# Patient Record
Sex: Male | Born: 1935 | Hispanic: No | Marital: Married | Smoking: Former smoker
Health system: Southern US, Community
[De-identification: ages and names within clinical notes are randomized; demographics above are authoritative.]

## PROBLEM LIST (undated history)

## (undated) DIAGNOSIS — I350 Nonrheumatic aortic (valve) stenosis: Secondary | ICD-10-CM

## (undated) DIAGNOSIS — I2510 Atherosclerotic heart disease of native coronary artery without angina pectoris: Secondary | ICD-10-CM

## (undated) DIAGNOSIS — I5042 Chronic combined systolic (congestive) and diastolic (congestive) heart failure: Secondary | ICD-10-CM

## (undated) DIAGNOSIS — R7301 Impaired fasting glucose: Secondary | ICD-10-CM

## (undated) DIAGNOSIS — I4892 Unspecified atrial flutter: Secondary | ICD-10-CM

## (undated) DIAGNOSIS — I447 Left bundle-branch block, unspecified: Secondary | ICD-10-CM

## (undated) DIAGNOSIS — I420 Dilated cardiomyopathy: Secondary | ICD-10-CM

## (undated) DIAGNOSIS — I10 Essential (primary) hypertension: Secondary | ICD-10-CM

## (undated) DIAGNOSIS — E7800 Pure hypercholesterolemia, unspecified: Secondary | ICD-10-CM

## (undated) HISTORY — DX: Nonrheumatic aortic (valve) stenosis: I35.0

## (undated) HISTORY — DX: Atherosclerotic heart disease of native coronary artery without angina pectoris: I25.10

## (undated) HISTORY — DX: Pure hypercholesterolemia, unspecified: E78.00

## (undated) HISTORY — DX: Dilated cardiomyopathy: I42.0

## (undated) HISTORY — DX: Impaired fasting glucose: R73.01

## (undated) HISTORY — DX: Unspecified atrial flutter: I48.92

## (undated) HISTORY — DX: Left bundle-branch block, unspecified: I44.7

## (undated) HISTORY — DX: Chronic combined systolic (congestive) and diastolic (congestive) heart failure: I50.42

## (undated) HISTORY — DX: Essential (primary) hypertension: I10

---

## 1994-11-13 HISTORY — PX: AORTIC VALVE REPLACEMENT: SHX41

## 1995-05-14 DIAGNOSIS — I2510 Atherosclerotic heart disease of native coronary artery without angina pectoris: Secondary | ICD-10-CM

## 1995-05-14 HISTORY — DX: Atherosclerotic heart disease of native coronary artery without angina pectoris: I25.10

## 2010-07-27 ENCOUNTER — Ambulatory Visit: Payer: Self-pay | Admitting: Cardiology

## 2010-09-30 ENCOUNTER — Ambulatory Visit: Payer: Self-pay | Admitting: Cardiology

## 2011-07-18 ENCOUNTER — Encounter: Payer: Self-pay | Admitting: Cardiology

## 2011-07-18 ENCOUNTER — Ambulatory Visit (INDEPENDENT_AMBULATORY_CARE_PROVIDER_SITE_OTHER): Payer: Medicare Other | Admitting: Cardiology

## 2011-07-18 VITALS — BP 120/66 | HR 64 | Ht 67.0 in | Wt 159.0 lb

## 2011-07-18 DIAGNOSIS — I350 Nonrheumatic aortic (valve) stenosis: Secondary | ICD-10-CM

## 2011-07-18 DIAGNOSIS — I447 Left bundle-branch block, unspecified: Secondary | ICD-10-CM

## 2011-07-18 DIAGNOSIS — E7800 Pure hypercholesterolemia, unspecified: Secondary | ICD-10-CM

## 2011-07-18 DIAGNOSIS — Z952 Presence of prosthetic heart valve: Secondary | ICD-10-CM

## 2011-07-18 DIAGNOSIS — I2510 Atherosclerotic heart disease of native coronary artery without angina pectoris: Secondary | ICD-10-CM

## 2011-07-18 DIAGNOSIS — I428 Other cardiomyopathies: Secondary | ICD-10-CM

## 2011-07-18 DIAGNOSIS — I359 Nonrheumatic aortic valve disorder, unspecified: Secondary | ICD-10-CM

## 2011-07-18 DIAGNOSIS — I10 Essential (primary) hypertension: Secondary | ICD-10-CM

## 2011-07-18 DIAGNOSIS — I420 Dilated cardiomyopathy: Secondary | ICD-10-CM | POA: Insufficient documentation

## 2011-07-18 MED ORDER — METOPROLOL SUCCINATE ER 25 MG PO TB24: 12.5000 mg | ORAL_TABLET | Freq: Every day | ORAL | Status: DC

## 2011-07-18 NOTE — Patient Instructions (Addendum)
Continue your current medications.  We will schedule you for a follow echocardiogram.  We will start Toprol XL 25 mg one half tablet daily.  I will see you again in 6 months.

## 2011-07-18 NOTE — Progress Notes (Signed)
   [name] Date of Birth: 1930/05/30   History of Present Illness: [name] is seen today for followup of his valvular heart disease. He states he feels very well. His energy level is excellent. 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 He denies any shortness of breath or chest pain. He has had no increase in edema or orthopnea. Previously Coreg made him feel very fatigued and so he quit taking it. He has also been switched from Vytorin to simvastatin and from Diovan to losartan.  Current Outpatient Prescriptions on File Prior to Visit  Medication Sig Dispense Refill  . Cetirizine HCl (ZYRTEC PO) Take by mouth.        . losartan (COZAAR) 100 MG tablet Take 100 mg by mouth daily.        . Saw Palmetto, Serenoa repens, (SAW PALMETTO PO) Take by mouth.        . simvastatin (ZOCOR) 20 MG tablet Take 20 mg by mouth every other day.       . spironolactone (ALDACTONE) 25 MG tablet Take 25 mg by mouth daily.        . Tamsulosin HCl (FLOMAX) 0.4 MG CAPS Take by mouth.        . warfarin (COUMADIN) 3 MG tablet Take 3 mg by mouth daily. Take as Directed         No Known Allergies  Past Medical History  Diagnosis Date  . Aortic stenosis     Status pst redo aortic valve replacement in 1996 with mechanical prosthesis  . Coronary artery disease 05-1995    Status post stent  . Bundle branch block, left   . Hypertension   . Hypercholesterolemia   . Dilated cardiomyopathy     Past Surgical History  Procedure Date  . Aortic valve replacement 1996    History  Smoking status  . Former Smoker  Smokeless tobacco  . Not on file    History  Alcohol Use No    History reviewed. No pertinent family history.  Review of Systems: The review of systems is positive for low testosterone. He was placed on a supplement but this made his PSA level increased and it was discontinued. All other systems were reviewed and are negative.  Physical Exam: BP 120/66   Pulse 64  Ht 5\' 7"  (1.702 m)  Wt 159 lb (72.122 kg)  BMI 24.90 kg/m2 The patient is alert and oriented x 3.  The mood and affect are normal.  The skin is warm and dry.  Color is normal.  The HEENT exam reveals that the sclera are nonicteric.  The mucous membranes are moist.  The carotids are 2+ without bruits.  There is no thyromegaly.  There is no JVD.  The lungs are clear.  The chest wall is non tender.  The heart exam reveals a regular rate with a good mechanical aortic valve click.  There are no murmurs, gallops, or rubs.  The PMI is not displaced.   Abdominal exam reveals good bowel sounds.  There is no guarding or rebound.  There is no hepatosplenomegaly or tenderness.  There are no masses.  Exam of the legs reveal no clubbing, cyanosis, or edema.  The legs are without rashes.  The distal pulses are intact.  Cranial nerves II - XII are intact.  Motor and sensory functions are intact.  The gait is normal. LABORATORY DATA:   Assessment / Plan:

## 2011-07-18 NOTE — Assessment & Plan Note (Signed)
Blood pressure is well controlled on his current medication.

## 2011-07-18 NOTE — Assessment & Plan Note (Signed)
He is asymptomatic and his valve appears to be functioning well on exam. He is on chronic anticoagulation. He needs routine SBE prophylaxis.

## 2011-07-18 NOTE — Assessment & Plan Note (Signed)
He does have a chronic left bundle branch block. His last echocardiogram from one year ago showed an ejection fraction of 30%. He is on chronic therapy with an ARB and spironolactone. He was intolerant of Coreg. We will try him on metoprolol 12.5 mg daily. We will schedule him for a followup echocardiogram. He is currently asymptomatic.

## 2011-07-19 ENCOUNTER — Ambulatory Visit (HOSPITAL_COMMUNITY): Payer: Medicare Other | Attending: Cardiology

## 2011-07-19 DIAGNOSIS — Z954 Presence of other heart-valve replacement: Secondary | ICD-10-CM | POA: Insufficient documentation

## 2011-07-19 DIAGNOSIS — E785 Hyperlipidemia, unspecified: Secondary | ICD-10-CM | POA: Insufficient documentation

## 2011-07-19 DIAGNOSIS — I10 Essential (primary) hypertension: Secondary | ICD-10-CM | POA: Insufficient documentation

## 2011-07-19 DIAGNOSIS — Z952 Presence of prosthetic heart valve: Secondary | ICD-10-CM

## 2011-07-19 DIAGNOSIS — I447 Left bundle-branch block, unspecified: Secondary | ICD-10-CM | POA: Insufficient documentation

## 2011-07-19 DIAGNOSIS — I080 Rheumatic disorders of both mitral and aortic valves: Secondary | ICD-10-CM | POA: Insufficient documentation

## 2011-07-19 DIAGNOSIS — I379 Nonrheumatic pulmonary valve disorder, unspecified: Secondary | ICD-10-CM | POA: Insufficient documentation

## 2011-07-19 DIAGNOSIS — I2510 Atherosclerotic heart disease of native coronary artery without angina pectoris: Secondary | ICD-10-CM | POA: Insufficient documentation

## 2011-07-19 DIAGNOSIS — I359 Nonrheumatic aortic valve disorder, unspecified: Secondary | ICD-10-CM

## 2011-07-19 DIAGNOSIS — I079 Rheumatic tricuspid valve disease, unspecified: Secondary | ICD-10-CM | POA: Insufficient documentation

## 2011-07-20 ENCOUNTER — Telehealth: Payer: Self-pay | Admitting: *Deleted

## 2011-07-20 NOTE — Telephone Encounter (Signed)
Message copied by Lorayne Bender on Thu Jul 20, 2011 10:17 AM ------      Message from: [place], PETER M      Created: Wed Jul 19, 2011  9:03 PM       EF is still low. AV prosthesis is functioning well. We'll see how he responds to metoprolol. May need to consider ICD in future.      Theron Arista [place]

## 2011-07-20 NOTE — Telephone Encounter (Signed)
Notified of Echo results. Advised to notify us if unable to tolerate Metoprolol before he just stops taking medication. States they do not want ICD unless absolute necessary.

## 2011-07-28 ENCOUNTER — Telehealth: Payer: Self-pay | Admitting: Cardiology

## 2011-07-28 NOTE — Telephone Encounter (Signed)
Wife called stating Christopher Mann can not tolerate Toprol. Causing muscle ache and soreness. Is refusing to take any more medication. Advised Dr. [place]. Was given to help w/LV function.

## 2011-07-28 NOTE — Telephone Encounter (Signed)
Pt's wife states she was told to call the office back if her husband has any difficulties with his new medication.  Her husband will not take his medication that he switched to.  The name of the medication is Toprol XL 25mg , at half a one per day.  Pt states it makes him sore and muscle ache.  Pt is aware since after 4pm may not receive call until Monday morning.  Synetta Fail has been notified.  Please call pt back.

## 2011-08-22 ENCOUNTER — Telehealth: Payer: Self-pay | Admitting: Cardiology

## 2011-08-22 NOTE — Telephone Encounter (Signed)
Wife called stating that Christopher Mann is taking the Metoprolol 12.5 mg as instructed by Dr. [place]. Also wants to know what other medication he could be on for cholesterol.. He has tried Vytorin; is currently taking Zocor 20 mg; but continues to have aching in shoulders and legs. Advised will get chart and speak w/Dr. [place] tomorrow and call her back. States he was on another med before seeing Dr. [place] but doesn't remember what that was. Will call her after getting chart.

## 2011-08-22 NOTE — Telephone Encounter (Signed)
Pt's wife called about zocor med he was taking before began seeing dr [place] please call

## 2011-08-23 ENCOUNTER — Telehealth: Payer: Self-pay | Admitting: Cardiology

## 2011-08-23 NOTE — Telephone Encounter (Signed)
Pt returning your call

## 2011-08-31 MED ORDER — ATORVASTATIN CALCIUM 10 MG PO TABS: 10.0000 mg | ORAL_TABLET | Freq: Every day | ORAL | Status: DC

## 2011-08-31 NOTE — Telephone Encounter (Signed)
Have tried muotiple times to get in touch w/ pt. Wife states Zocor that he is taking every other day is causing him to have aching "all over". Dr. [place] suggested he try Lipitor 10 mg. Advised to stop Zocor x 2 wks before starting on Lipitor. Will need to get labs in 3 mo. Will send Rx to [facility].

## 2011-12-29 ENCOUNTER — Ambulatory Visit: Payer: Medicare Other | Admitting: Cardiology

## 2011-12-29 ENCOUNTER — Other Ambulatory Visit (INDEPENDENT_AMBULATORY_CARE_PROVIDER_SITE_OTHER): Payer: Medicare Other | Admitting: *Deleted

## 2011-12-29 ENCOUNTER — Other Ambulatory Visit: Payer: Self-pay

## 2011-12-29 DIAGNOSIS — E7800 Pure hypercholesterolemia, unspecified: Secondary | ICD-10-CM

## 2011-12-29 DIAGNOSIS — I2510 Atherosclerotic heart disease of native coronary artery without angina pectoris: Secondary | ICD-10-CM

## 2011-12-29 DIAGNOSIS — I350 Nonrheumatic aortic (valve) stenosis: Secondary | ICD-10-CM

## 2011-12-29 DIAGNOSIS — I359 Nonrheumatic aortic valve disorder, unspecified: Secondary | ICD-10-CM

## 2011-12-29 DIAGNOSIS — I10 Essential (primary) hypertension: Secondary | ICD-10-CM

## 2011-12-29 LAB — HEPATIC FUNCTION PANEL
ALT: 18 U/L (ref 0–53)
Total Bilirubin: 0.5 mg/dL (ref 0.3–1.2)

## 2011-12-29 LAB — BASIC METABOLIC PANEL
CO2: 22 mEq/L (ref 19–32)
Calcium: 8.8 mg/dL (ref 8.4–10.5)
Chloride: 109 mEq/L (ref 96–112)
Potassium: 4.4 mEq/L (ref 3.5–5.1)
Sodium: 139 mEq/L (ref 135–145)

## 2011-12-29 LAB — LIPID PANEL
HDL: 33.5 mg/dL — ABNORMAL LOW (ref >39.00)
Total CHOL/HDL Ratio: 5
Triglycerides: 317 mg/dL — ABNORMAL HIGH (ref 0.0–149.0)

## 2011-12-29 LAB — LDL CHOLESTEROL, DIRECT: Direct LDL: 83.2 mg/dL

## 2012-01-04 ENCOUNTER — Telehealth: Payer: Self-pay | Admitting: Cardiology

## 2012-01-04 NOTE — Telephone Encounter (Signed)
New problem:  Patient  Wife calling regarding test results.

## 2012-01-05 ENCOUNTER — Encounter: Payer: Self-pay | Admitting: *Deleted

## 2012-01-05 NOTE — Telephone Encounter (Signed)
Patient's wife called was given lab results. Advised to take fish oil 2 grams daily.

## 2012-01-10 ENCOUNTER — Ambulatory Visit (INDEPENDENT_AMBULATORY_CARE_PROVIDER_SITE_OTHER): Payer: Medicare Other | Admitting: Cardiology

## 2012-01-10 ENCOUNTER — Encounter: Payer: Self-pay | Admitting: Cardiology

## 2012-01-10 VITALS — BP 102/52 | HR 58 | Ht 67.0 in | Wt 159.0 lb

## 2012-01-10 DIAGNOSIS — I10 Essential (primary) hypertension: Secondary | ICD-10-CM

## 2012-01-10 DIAGNOSIS — Z952 Presence of prosthetic heart valve: Secondary | ICD-10-CM

## 2012-01-10 DIAGNOSIS — I428 Other cardiomyopathies: Secondary | ICD-10-CM

## 2012-01-10 DIAGNOSIS — I447 Left bundle-branch block, unspecified: Secondary | ICD-10-CM

## 2012-01-10 DIAGNOSIS — I359 Nonrheumatic aortic valve disorder, unspecified: Secondary | ICD-10-CM

## 2012-01-10 DIAGNOSIS — I350 Nonrheumatic aortic (valve) stenosis: Secondary | ICD-10-CM

## 2012-01-10 DIAGNOSIS — I420 Dilated cardiomyopathy: Secondary | ICD-10-CM

## 2012-01-10 DIAGNOSIS — E785 Hyperlipidemia, unspecified: Secondary | ICD-10-CM

## 2012-01-10 NOTE — Assessment & Plan Note (Signed)
He is status post redo aortic valve replacement in 1996 with a mechanical prosthesis. He is on chronic Coumadin therapy. His valve sounds crisp on exam today. Continue his current therapy.

## 2012-01-10 NOTE — Patient Instructions (Signed)
Continue your current medication  I will see you again in 6 months with fasting lab.

## 2012-01-10 NOTE — Assessment & Plan Note (Signed)
Blood pressure is lower now with the addition of beta blocker. I don't think we can titrate his medications further.

## 2012-01-10 NOTE — Progress Notes (Signed)
   Christopher Mann Date of Birth: 1930-01-31   History of Present Illness: [name] is seen today for followup of his valvular heart disease. He states he feels very well. His energy level is excellent. 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 He denies any shortness of breath or chest pain. He has had no increase in edema or orthopnea. His last echocardiogram showed persistent low ejection fraction. We added a low dose metoprolol and he has tolerated this well.  Current Outpatient Prescriptions on File Prior to Visit  Medication Sig Dispense Refill  . Cetirizine HCl (ZYRTEC PO) Take by mouth.        . losartan (COZAAR) 100 MG tablet Take 100 mg by mouth daily.        . metoprolol succinate (TOPROL-XL) 25 MG 24 hr tablet Take 0.5 tablets (12.5 mg total) by mouth daily.  15 tablet  6  . Saw Palmetto, Serenoa repens, (SAW PALMETTO PO) Take by mouth.        . spironolactone (ALDACTONE) 25 MG tablet Take 25 mg by mouth daily.        . Tamsulosin HCl (FLOMAX) 0.4 MG CAPS Take by mouth.        . warfarin (COUMADIN) 3 MG tablet Take 3 mg by mouth daily. Take as Directed         No Known Allergies  Past Medical History  Diagnosis Date  . Aortic stenosis     Status pst redo aortic valve replacement in 1996 with mechanical prosthesis  . Coronary artery disease 05-1995    Status post stent  . Bundle branch block, left   . Hypertension   . Hypercholesterolemia   . Dilated cardiomyopathy     Past Surgical History  Procedure Date  . Aortic valve replacement 1996    History  Smoking status  . Former Smoker  Smokeless tobacco  . Not on file    History  Alcohol Use No    History reviewed. No pertinent family history.  Review of Systems: As noted in history of present illness. All other systems were reviewed and are negative.  Physical Exam: BP 102/52  Pulse 58  Ht 5\' 7"  (1.702 m)  Wt 159 lb (72.122 kg)  BMI 24.90 kg/m2 The patient is alert and  oriented x 3.  The mood and affect are normal.  The skin is warm and dry.  Color is normal.  The HEENT exam reveals that the sclera are nonicteric.  The mucous membranes are moist.  The carotids are 2+ without bruits.  There is no thyromegaly.  There is no JVD.  The lungs are clear.  The chest wall is non tender.  The heart exam reveals a regular rate with a good mechanical aortic valve click.  There are no murmurs, gallops, or rubs.  The PMI is not displaced.   Abdominal exam reveals good bowel sounds.  There is no guarding or rebound.  There is no hepatosplenomegaly or tenderness.  There are no masses.  Exam of the legs reveal no clubbing, cyanosis, or edema.  The legs are without rashes.  The distal pulses are intact.  Cranial nerves II - XII are intact.  Motor and sensory functions are intact.  The gait is normal. LABORATORY DATA: ECG today demonstrates normal sinus rhythm with first degree AV block. There are occasional PVCs. He has a left bundle branch block which is chronic.  Assessment / Plan:

## 2012-01-10 NOTE — Assessment & Plan Note (Signed)
He has a persistent low ejection fraction related to his valvular disease. He is on optimal therapy now with beta blocker, ARB, and Aldactone. He is well beta blocked. He has no overt evidence of congestive heart failure. Continue on his medical therapy and followup again in 6 months.

## 2012-01-19 ENCOUNTER — Telehealth: Payer: Self-pay | Admitting: Cardiology

## 2012-01-19 NOTE — Telephone Encounter (Signed)
[facility] refill request Jantoven 3mg  , pt requesting #120mg 

## 2012-01-22 ENCOUNTER — Other Ambulatory Visit: Payer: Self-pay

## 2012-01-22 MED ORDER — LOSARTAN POTASSIUM 100 MG PO TABS: 100.0000 mg | ORAL_TABLET | Freq: Every day | ORAL | Status: DC

## 2012-01-22 NOTE — Telephone Encounter (Signed)
Pt calling re rx request, pt calling from pharmacy and they do not have the med yet, pt out now . pls call, asap

## 2012-01-23 ENCOUNTER — Other Ambulatory Visit: Payer: Self-pay

## 2012-01-23 MED ORDER — METOPROLOL SUCCINATE ER 25 MG PO TB24: 12.5000 mg | ORAL_TABLET | Freq: Every day | ORAL | Status: DC

## 2012-01-23 MED ORDER — SIMVASTATIN 20 MG PO TABS: 20.0000 mg | ORAL_TABLET | Freq: Every evening | ORAL | Status: DC

## 2012-01-23 MED ORDER — SPIRONOLACTONE 25 MG PO TABS: 25.0000 mg | ORAL_TABLET | Freq: Every day | ORAL | Status: DC

## 2012-01-23 MED ORDER — LOSARTAN POTASSIUM 100 MG PO TABS: 100.0000 mg | ORAL_TABLET | Freq: Every day | ORAL | Status: DC

## 2012-01-23 MED ORDER — TAMSULOSIN HCL 0.4 MG PO CAPS: 0.4000 mg | ORAL_CAPSULE | Freq: Every day | ORAL | Status: DC

## 2012-01-23 NOTE — Telephone Encounter (Signed)
Patient called needing refills on all of his medications sent to [facility].Metoprolol succ 25 mg,lorsartan 100 mg, spironolactone 25 mg, simvastatin 20 mg, tamsulosin 0.4 mg sent to [facility].

## 2012-02-14 ENCOUNTER — Other Ambulatory Visit: Payer: Self-pay

## 2012-02-15 MED ORDER — METOPROLOL SUCCINATE ER 25 MG PO TB24: 12.5000 mg | ORAL_TABLET | Freq: Every day | ORAL | Status: DC

## 2012-02-15 NOTE — Telephone Encounter (Signed)
..   Requested Prescriptions   Signed Prescriptions Disp Refills  . metoprolol succinate (TOPROL-XL) 25 MG 24 hr tablet 15 tablet 6    Sig: Take 0.5 tablets (12.5 mg total) by mouth daily.    Authorizing Provider: [place], PETER M    Ordering User: Lacie Scotts

## 2012-07-02 ENCOUNTER — Ambulatory Visit: Payer: Medicare Other | Admitting: Nurse Practitioner

## 2012-07-25 ENCOUNTER — Ambulatory Visit (INDEPENDENT_AMBULATORY_CARE_PROVIDER_SITE_OTHER): Payer: Medicare Other | Admitting: Cardiology

## 2012-07-25 ENCOUNTER — Encounter: Payer: Self-pay | Admitting: Cardiology

## 2012-07-25 VITALS — BP 140/60 | HR 55 | Ht 67.0 in | Wt 160.0 lb

## 2012-07-25 DIAGNOSIS — I359 Nonrheumatic aortic valve disorder, unspecified: Secondary | ICD-10-CM

## 2012-07-25 DIAGNOSIS — Z954 Presence of other heart-valve replacement: Secondary | ICD-10-CM

## 2012-07-25 DIAGNOSIS — Z952 Presence of prosthetic heart valve: Secondary | ICD-10-CM | POA: Insufficient documentation

## 2012-07-25 DIAGNOSIS — I10 Essential (primary) hypertension: Secondary | ICD-10-CM

## 2012-07-25 DIAGNOSIS — I420 Dilated cardiomyopathy: Secondary | ICD-10-CM

## 2012-07-25 DIAGNOSIS — I428 Other cardiomyopathies: Secondary | ICD-10-CM

## 2012-07-25 DIAGNOSIS — I2510 Atherosclerotic heart disease of native coronary artery without angina pectoris: Secondary | ICD-10-CM

## 2012-07-25 DIAGNOSIS — I350 Nonrheumatic aortic (valve) stenosis: Secondary | ICD-10-CM

## 2012-07-25 MED ORDER — SPIRONOLACTONE 25 MG PO TABS: 25.0000 mg | ORAL_TABLET | Freq: Every day | ORAL | Status: DC

## 2012-07-25 NOTE — Progress Notes (Signed)
Christopher Mann Date of Birth: 1930/10/18   History of Present Illness: Christopher Mann is seen today for surgical clearance for arthroscopic knee surgery. He has a known history of valvular heart disease. He is status post redo aortic valve replacement in 1996 with a mechanical prosthesis. He also has a history of coronary disease with a remote coronary stent. He has a history of dilated cardiomyopathy with ejection fraction of 25-30% by echocardiogram in September of 2012. He is on chronic Coumadin therapy. His major complaint today is of pain and limitation of motion in his left knee. He denies any symptoms of chest pain, shortness of breath, or palpitations. He's had no orthopnea or PND. He denies any increase weight or edema. He is tolerating his medications well.  Current Outpatient Prescriptions on File Prior to Visit  Medication Sig Dispense Refill  . losartan (COZAAR) 100 MG tablet Take 1 tablet (100 mg total) by mouth daily.  120 tablet  6  . metoprolol succinate (TOPROL-XL) 25 MG 24 hr tablet Take 0.5 tablets (12.5 mg total) by mouth daily.  15 tablet  6  . Saw Palmetto, Serenoa repens, (SAW PALMETTO PO) Take by mouth.        . spironolactone (ALDACTONE) 25 MG tablet Take 1 tablet (25 mg total) by mouth daily.  120 tablet  6  . Tamsulosin HCl (FLOMAX) 0.4 MG CAPS Take 1 capsule (0.4 mg total) by mouth daily.  120 capsule  6  . warfarin (COUMADIN) 3 MG tablet Take 3 mg by mouth daily. Take as Directed       . DISCONTD: simvastatin (ZOCOR) 20 MG tablet Take 1 tablet (20 mg total) by mouth every evening.  120 tablet  6    No Known Allergies  Past Medical History  Diagnosis Date  . Aortic stenosis     Status pst redo aortic valve replacement in 1996 with mechanical prosthesis  . Coronary artery disease 05-1995    Status post stent  . Bundle branch block, left   . Hypertension   . Hypercholesterolemia   . Dilated cardiomyopathy     Past Surgical History  Procedure Date  . Aortic  valve replacement 1996    History  Smoking status  . Former Smoker  Smokeless tobacco  . Not on file    History  Alcohol Use No    History reviewed. No pertinent family history.  Review of Systems: As noted in history of present illness. All other systems were reviewed and are negative.  Physical Exam: BP 140/60  Pulse 55  Ht 5\' 7"  (1.702 m)  Wt 160 lb (72.576 kg)  BMI 25.06 kg/m2 The patient is alert and oriented x 3.  The mood and affect are normal.  The skin is warm and dry.  Color is normal.  The HEENT exam reveals that the sclera are nonicteric.  The mucous membranes are moist.  The carotids are 2+ without bruits.  There is no thyromegaly.  There is no JVD.  The lungs are clear.  The chest wall is non tender.  The heart exam reveals a regular rate with a good mechanical aortic valve click.  There are no murmurs, gallops, or rubs.  The PMI is not displaced.   Abdominal exam reveals good bowel sounds.  There is no guarding or rebound.  There is no hepatosplenomegaly or tenderness.  There are no masses.  Exam of the legs reveal no clubbing, cyanosis, or edema.  The legs are without rashes.  The  distal pulses are intact.  Cranial nerves II - XII are intact.  Motor and sensory functions are intact.  The gait is normal.  LABORATORY DATA: ECG today demonstrates normal sinus rhythm a rate of 55 beats per minute. He has a chronic left bundle branch block.  Assessment / Plan: 1. Chronic systolic CHF with dilated cardiomyopathy. Ejection fraction of 25-30%. Patient is asymptomatic. Continue treatment with ARB, metoprolol, and Aldactone.  2. Status post redo aortic valve replacement with mechanical prosthesis. Functioning well by echocardiogram in September 2012.  3. Hypertension, controlled.  4. Coronary disease with remote coronary stent.  5. Meniscus tear and left knee. Patient is cleared for surgery from a cardiac standpoint. He will need bridging Lovenox therapy to come off his  Coumadin for surgery.

## 2012-11-29 ENCOUNTER — Other Ambulatory Visit: Payer: Self-pay

## 2012-11-29 MED ORDER — METOPROLOL SUCCINATE ER 25 MG PO TB24: 12.5000 mg | ORAL_TABLET | Freq: Every day | ORAL | Status: DC

## 2013-03-11 ENCOUNTER — Other Ambulatory Visit: Payer: Self-pay

## 2013-03-11 MED ORDER — METOPROLOL SUCCINATE ER 25 MG PO TB24: 12.5000 mg | ORAL_TABLET | Freq: Every day | ORAL | Status: DC

## 2013-05-19 ENCOUNTER — Telehealth: Payer: Self-pay | Admitting: Cardiology

## 2013-05-19 DIAGNOSIS — I2510 Atherosclerotic heart disease of native coronary artery without angina pectoris: Secondary | ICD-10-CM

## 2013-05-19 NOTE — Telephone Encounter (Signed)
Returned call to patient's wife no answer.Left message on personal voice mail will put in orders for patient to have fasting lab work on 05/23/13.

## 2013-05-19 NOTE — Telephone Encounter (Signed)
New Prob    Requesting fasting labs on day of appt 7/11. Orders needed in EPIC.

## 2013-05-23 ENCOUNTER — Ambulatory Visit (INDEPENDENT_AMBULATORY_CARE_PROVIDER_SITE_OTHER): Payer: Medicare Other | Admitting: Cardiology

## 2013-05-23 ENCOUNTER — Other Ambulatory Visit: Payer: Medicare Other

## 2013-05-23 ENCOUNTER — Ambulatory Visit: Payer: Medicare Other | Admitting: Cardiology

## 2013-05-23 ENCOUNTER — Encounter: Payer: Self-pay | Admitting: Cardiology

## 2013-05-23 VITALS — BP 134/72 | HR 54 | Ht 67.0 in | Wt 156.4 lb

## 2013-05-23 DIAGNOSIS — I447 Left bundle-branch block, unspecified: Secondary | ICD-10-CM

## 2013-05-23 DIAGNOSIS — I420 Dilated cardiomyopathy: Secondary | ICD-10-CM

## 2013-05-23 DIAGNOSIS — E7800 Pure hypercholesterolemia, unspecified: Secondary | ICD-10-CM

## 2013-05-23 DIAGNOSIS — I350 Nonrheumatic aortic (valve) stenosis: Secondary | ICD-10-CM

## 2013-05-23 DIAGNOSIS — I428 Other cardiomyopathies: Secondary | ICD-10-CM

## 2013-05-23 DIAGNOSIS — I2510 Atherosclerotic heart disease of native coronary artery without angina pectoris: Secondary | ICD-10-CM

## 2013-05-23 DIAGNOSIS — I359 Nonrheumatic aortic valve disorder, unspecified: Secondary | ICD-10-CM

## 2013-05-23 DIAGNOSIS — Z954 Presence of other heart-valve replacement: Secondary | ICD-10-CM

## 2013-05-23 DIAGNOSIS — Z952 Presence of prosthetic heart valve: Secondary | ICD-10-CM

## 2013-05-23 LAB — LIPID PANEL
HDL: 33.4 mg/dL — ABNORMAL LOW (ref >39.00)
Total CHOL/HDL Ratio: 6
Triglycerides: 233 mg/dL — ABNORMAL HIGH (ref 0.0–149.0)

## 2013-05-23 LAB — BASIC METABOLIC PANEL
CO2: 27 mEq/L (ref 19–32)
Chloride: 108 mEq/L (ref 96–112)
Sodium: 140 mEq/L (ref 135–145)

## 2013-05-23 LAB — HEPATIC FUNCTION PANEL: Total Bilirubin: 0.9 mg/dL (ref 0.3–1.2)

## 2013-05-23 LAB — LDL CHOLESTEROL, DIRECT: Direct LDL: 109.3 mg/dL

## 2013-05-23 NOTE — Progress Notes (Signed)
Christopher Mann Date of Birth: 1929/11/23   History of Present Illness: Christopher Mann is seen today for follow up. He has a known history of valvular heart disease. He is status post redo aortic valve replacement in 1996 with a mechanical prosthesis. He also has a history of coronary disease with a remote coronary stent. He has a history of dilated cardiomyopathy with ejection fraction of 25-30% by echocardiogram in September of 2012. He is on chronic Coumadin therapy. On followup today he reports he is doing very well. He is very active. The only time he gets short of breath is when he bends over. He has no increase in edema or orthopnea. He denies any chest pain or palpitations. He developed significant myalgias on Zocor to the point where he couldn't function. He stop taking this. He is now taking a supplement that contains plant sterol esters.  Current Outpatient Prescriptions on File Prior to Visit  Medication Sig Dispense Refill  . losartan (COZAAR) 100 MG tablet Take 1 tablet (100 mg total) by mouth daily.  120 tablet  6  . metoprolol succinate (TOPROL-XL) 25 MG 24 hr tablet Take 0.5 tablets (12.5 mg total) by mouth daily.  45 tablet  2  . Saw Palmetto, Serenoa repens, (SAW PALMETTO PO) Take by mouth.        . spironolactone (ALDACTONE) 25 MG tablet Take 1 tablet (25 mg total) by mouth daily.  120 tablet  6  . Tamsulosin HCl (FLOMAX) 0.4 MG CAPS Take 1 capsule (0.4 mg total) by mouth daily.  120 capsule  6  . warfarin (COUMADIN) 3 MG tablet Take 3 mg by mouth daily. Take as Directed        No current facility-administered medications on file prior to visit.    No Known Allergies  Past Medical History  Diagnosis Date  . Aortic stenosis     Status pst redo aortic valve replacement in 1996 with mechanical prosthesis  . Coronary artery disease 05-1995    Status post stent  . Bundle branch block, left   . Hypertension   . Hypercholesterolemia   . Dilated cardiomyopathy     Past  Surgical History  Procedure Laterality Date  . Aortic valve replacement  1996    History  Smoking status  . Former Smoker  Smokeless tobacco  . Not on file    History  Alcohol Use No    History reviewed. No pertinent family history.  Review of Systems: As noted in history of present illness. All other systems were reviewed and are negative.  Physical Exam: BP 134/72  Pulse 54  Ht 5\' 7"  (1.702 m)  Wt 156 lb 6.4 oz (70.943 kg)  BMI 24.49 kg/m2  SpO2 97% The patient is alert and oriented x 3.    The skin is warm and dry.  Color is normal.  The HEENT exam reveals that the sclera are nonicteric.  The mucous membranes are moist.  The carotids are 2+ without bruits.  There is no thyromegaly.  There is no JVD.  The lungs are clear.   The heart exam reveals a regular rate with a good mechanical aortic valve click.  There are no murmurs, gallops, or rubs.  The PMI is not displaced.   Abdominal exam reveals good bowel sounds.    There is no hepatosplenomegaly or tenderness.  There are no masses.  Exam of the legs reveal no clubbing, cyanosis, or edema.   The distal pulses are intact.  Cranial  nerves II - XII are intact.  Motor and sensory functions are intact.  The gait is normal.  LABORATORY DATA: Fasting lab work is pending today.  Assessment / Plan: 1. Chronic systolic CHF with dilated cardiomyopathy. Ejection fraction of 25-30%. Patient is asymptomatic. Continue treatment with ARB, metoprolol, and Aldactone.  2. Status post redo aortic valve replacement with mechanical prosthesis. Functioning well by echocardiogram in September 2012.  3. Hypertension, controlled.  4. Coronary disease with remote coronary stent. Asymptomatic.  5. Hyperlipidemia. Intolerant to statin therapy. We will check fasting lab work today and make recommendations. On his last lab work his triglycerides were elevated and he had a low HDL so we may recommend starting with fish oil supplements.  4. Coronary  disease with remote coronary stent.  5. Meniscus tear and left knee. Patient is cleared for surgery from a cardiac standpoint. He will need bridging Lovenox therapy to come off his Coumadin for surgery.

## 2013-05-23 NOTE — Patient Instructions (Signed)
We will check your lab work today   Continue your other therapy.  I will see you in 6 months.

## 2013-05-23 NOTE — Addendum Note (Signed)
Addended by: Awilda Bill on: 05/23/2013 12:41 PM   Modules accepted: Orders

## 2013-05-26 ENCOUNTER — Other Ambulatory Visit: Payer: Self-pay | Admitting: *Deleted

## 2013-05-26 MED ORDER — LOSARTAN POTASSIUM 100 MG PO TABS: 100.0000 mg | ORAL_TABLET | Freq: Every day | ORAL | Status: DC

## 2013-05-27 ENCOUNTER — Telehealth: Payer: Self-pay | Admitting: Cardiology

## 2013-05-27 ENCOUNTER — Other Ambulatory Visit: Payer: Self-pay

## 2013-05-27 DIAGNOSIS — E7800 Pure hypercholesterolemia, unspecified: Secondary | ICD-10-CM

## 2013-05-27 NOTE — Telephone Encounter (Signed)
Follow Up ° °Pt returning call from earlier. Please call. °

## 2013-05-28 NOTE — Telephone Encounter (Signed)
Spoke to patient's wife 05/27/13 lab results given.

## 2013-05-28 NOTE — Telephone Encounter (Signed)
Returned call to patient no answer.LMTC. 

## 2013-10-20 ENCOUNTER — Other Ambulatory Visit: Payer: Self-pay

## 2013-10-20 DIAGNOSIS — I420 Dilated cardiomyopathy: Secondary | ICD-10-CM

## 2013-10-20 MED ORDER — SPIRONOLACTONE 25 MG PO TABS: 25.0000 mg | ORAL_TABLET | Freq: Every day | ORAL | Status: DC

## 2013-10-20 MED ORDER — LOSARTAN POTASSIUM 100 MG PO TABS: 100.0000 mg | ORAL_TABLET | Freq: Every day | ORAL | Status: DC

## 2013-12-11 ENCOUNTER — Encounter: Payer: Self-pay | Admitting: Cardiology

## 2013-12-11 ENCOUNTER — Ambulatory Visit (INDEPENDENT_AMBULATORY_CARE_PROVIDER_SITE_OTHER): Payer: Medicare Other | Admitting: Cardiology

## 2013-12-11 VITALS — BP 118/68 | HR 52 | Ht 67.0 in | Wt 159.8 lb

## 2013-12-11 DIAGNOSIS — I428 Other cardiomyopathies: Secondary | ICD-10-CM

## 2013-12-11 DIAGNOSIS — I2510 Atherosclerotic heart disease of native coronary artery without angina pectoris: Secondary | ICD-10-CM

## 2013-12-11 DIAGNOSIS — I420 Dilated cardiomyopathy: Secondary | ICD-10-CM

## 2013-12-11 DIAGNOSIS — Z952 Presence of prosthetic heart valve: Secondary | ICD-10-CM

## 2013-12-11 DIAGNOSIS — I350 Nonrheumatic aortic (valve) stenosis: Secondary | ICD-10-CM

## 2013-12-11 DIAGNOSIS — Z954 Presence of other heart-valve replacement: Secondary | ICD-10-CM

## 2013-12-11 DIAGNOSIS — I447 Left bundle-branch block, unspecified: Secondary | ICD-10-CM

## 2013-12-11 DIAGNOSIS — I359 Nonrheumatic aortic valve disorder, unspecified: Secondary | ICD-10-CM

## 2013-12-11 LAB — LDL CHOLESTEROL, DIRECT: LDL DIRECT: 109.7 mg/dL

## 2013-12-11 LAB — LIPID PANEL
Cholesterol: 191 mg/dL (ref 0–200)
HDL: 32.1 mg/dL — ABNORMAL LOW (ref >39.00)
Total CHOL/HDL Ratio: 6
Triglycerides: 248 mg/dL — ABNORMAL HIGH (ref 0.0–149.0)
VLDL: 49.6 mg/dL — AB (ref 0.0–40.0)

## 2013-12-11 NOTE — Patient Instructions (Signed)
We will check your lab work today.  Continue your current therapy.  I will see you in 6 months.

## 2013-12-11 NOTE — Progress Notes (Signed)
Meredith LeedsWilliam Ridolfi Date of Birth: 20-Jan-1930   History of Present Illness: [name] is seen today for follow up. He has a known history of valvular heart disease. He is status post redo aortic valve replacement in 1996 with a mechanical prosthesis. He also has a history of coronary disease with a remote coronary stent. He has a history of dilated cardiomyopathy with ejection fraction of 25-30% by echocardiogram in September of 2012. He is on chronic Coumadin therapy. On followup today he reports he is doing very well. He is very active. He denies dyspnea, increase in edema or orthopnea. He denies any chest pain or palpitations. He is intolerant of statins.  Current Outpatient Prescriptions on File Prior to Visit  Medication Sig Dispense Refill  . losartan (COZAAR) 100 MG tablet Take 1 tablet (100 mg total) by mouth daily.  90 tablet  1  . Omega-3 Fatty Acids (FISH OIL) 1000 MG CAPS Take 2 tablets( 2000 mg) twice a day  100 capsule  6  . Saw Palmetto, Serenoa repens, (SAW PALMETTO PO) Take by mouth.        . spironolactone (ALDACTONE) 25 MG tablet Take 1 tablet (25 mg total) by mouth daily.  120 tablet  6  . Tamsulosin HCl (FLOMAX) 0.4 MG CAPS Take 1 capsule (0.4 mg total) by mouth daily.  120 capsule  6  . warfarin (COUMADIN) 3 MG tablet Take 3 mg by mouth daily. Take as Directed        No current facility-administered medications on file prior to visit.    No Known Allergies  Past Medical History  Diagnosis Date  . Aortic stenosis     Status pst redo aortic valve replacement in 1996 with mechanical prosthesis  . Coronary artery disease 05-1995    Status post stent  . Bundle branch block, left   . Hypertension   . Hypercholesterolemia   . Dilated cardiomyopathy     Past Surgical History  Procedure Laterality Date  . Aortic valve replacement  1996    History  Smoking status  . Former Smoker  Smokeless tobacco  . Not on file    History  Alcohol Use No    History  reviewed. No pertinent family history.  Review of Systems: As noted in history of present illness. All other systems were reviewed and are negative.  Physical Exam: BP 118/68  Pulse 52  Ht 5\' 7"  (1.702 m)  Wt 159 lb 12.8 oz (72.485 kg)  BMI 25.02 kg/m2 The patient is alert and oriented x 3.    The skin is warm and dry.  Color is normal.  The HEENT exam reveals that the sclera are nonicteric.  The mucous membranes are moist.  The carotids are 2+ without bruits.  There is no thyromegaly.  There is no JVD.  The lungs are clear.   The heart exam reveals a regular rate with a good mechanical aortic valve click.  There are no murmurs, gallops, or rubs.  The PMI is not displaced.   Abdominal exam reveals good bowel sounds.    There is no hepatosplenomegaly or tenderness.  There are no masses.  Exam of the legs reveal no clubbing, cyanosis, or edema.   The distal pulses are intact.  Cranial nerves II - XII are intact.  Motor and sensory functions are intact.  The gait is normal.  LABORATORY DATA: Ecg: NSR rate 59 bpm, LBBB.  Assessment / Plan: 1. Chronic systolic CHF with dilated cardiomyopathy. Ejection fraction  of 25-30%. Patient is asymptomatic. Continue treatment with ARB, metoprolol, and Aldactone.  2. Status post redo aortic valve replacement with mechanical prosthesis. Functioning well by echocardiogram in September 2012.  3. Hypertension, controlled.  4. Coronary disease with remote coronary stent. Asymptomatic.  5. Hyperlipidemia. Intolerant to statin therapy. Taking OTC plant sterol esters. Will check lipid panel today.  4. Coronary disease with remote coronary stent.  5. Meniscus tear and left knee. Symptoms resolved. Did not have surgery.

## 2014-04-23 ENCOUNTER — Telehealth: Payer: Self-pay | Admitting: Cardiology

## 2014-04-23 DIAGNOSIS — E7800 Pure hypercholesterolemia, unspecified: Secondary | ICD-10-CM

## 2014-04-23 DIAGNOSIS — I2510 Atherosclerotic heart disease of native coronary artery without angina pectoris: Secondary | ICD-10-CM

## 2014-04-23 DIAGNOSIS — I10 Essential (primary) hypertension: Secondary | ICD-10-CM

## 2014-04-23 NOTE — Telephone Encounter (Signed)
I spoke with pt wife. Pt is not having any problems at this time. He is due around August for his 6 month follow-up & would like an appointment around that time I told her I would forward this to his nurse Elnita Maxwell to look at Dr. Elvis Coil schedule & call back to pt.  Wife agrees to this & is reassured Mylo Red RN

## 2014-04-23 NOTE — Telephone Encounter (Signed)
New Message:  Mrs. Orne called to set up pt's recall appt.. We scheduled Christopher Mann for the next available.. Mrs Pont is requesting the pt be worked in to see Dr. Swaziland for a sooner appt. States she has some concerns about his medications. Requests a call back from the nurse

## 2014-04-24 NOTE — Telephone Encounter (Signed)
Returned call to patient's wife no answer.Unable to leave a message mail box full.

## 2014-04-24 NOTE — Addendum Note (Signed)
Addended by: Meda Klinefelter D on: 04/24/2014 02:58 PM   Modules accepted: Orders

## 2014-04-24 NOTE — Telephone Encounter (Signed)
Received call from patient's wife.She stated husband would like fasting lab work before appointment with Dr.Jordan.Stated he would also like appointment before Sept.Fasting lab appointment scheduled 04/27/14.Appointment scheduled with Dr.Jordan 07/01/14 at [facility] office.

## 2014-04-27 ENCOUNTER — Other Ambulatory Visit (INDEPENDENT_AMBULATORY_CARE_PROVIDER_SITE_OTHER): Payer: Medicare Other

## 2014-04-27 DIAGNOSIS — I10 Essential (primary) hypertension: Secondary | ICD-10-CM

## 2014-04-27 DIAGNOSIS — E7800 Pure hypercholesterolemia, unspecified: Secondary | ICD-10-CM

## 2014-04-27 DIAGNOSIS — I2510 Atherosclerotic heart disease of native coronary artery without angina pectoris: Secondary | ICD-10-CM

## 2014-04-27 LAB — HEPATIC FUNCTION PANEL
ALBUMIN: 3.7 g/dL (ref 3.5–5.2)
ALT: 14 U/L (ref 0–53)
AST: 25 U/L (ref 0–37)
Alkaline Phosphatase: 43 U/L (ref 39–117)
BILIRUBIN DIRECT: 0 mg/dL (ref 0.0–0.3)
Total Bilirubin: 0.4 mg/dL (ref 0.2–1.2)
Total Protein: 6 g/dL (ref 6.0–8.3)

## 2014-04-27 LAB — BASIC METABOLIC PANEL
BUN: 35 mg/dL — ABNORMAL HIGH (ref 6–23)
CO2: 25 mEq/L (ref 19–32)
Calcium: 9.1 mg/dL (ref 8.4–10.5)
Chloride: 108 mEq/L (ref 96–112)
Creatinine, Ser: 1.6 mg/dL — ABNORMAL HIGH (ref 0.4–1.5)
GFR: 43.92 mL/min — AB (ref >60.00)
Glucose, Bld: 237 mg/dL — ABNORMAL HIGH (ref 70–99)
POTASSIUM: 4.2 meq/L (ref 3.5–5.1)
SODIUM: 137 meq/L (ref 135–145)

## 2014-04-27 LAB — LIPID PANEL
Cholesterol: 175 mg/dL (ref 0–200)
HDL: 30.2 mg/dL — ABNORMAL LOW (ref >39.00)
LDL Cholesterol: 92 mg/dL (ref 0–99)
NonHDL: 144.8
TRIGLYCERIDES: 266 mg/dL — AB (ref 0.0–149.0)
Total CHOL/HDL Ratio: 6
VLDL: 53.2 mg/dL — ABNORMAL HIGH (ref 0.0–40.0)

## 2014-06-24 ENCOUNTER — Other Ambulatory Visit: Payer: Self-pay | Admitting: *Deleted

## 2014-07-01 ENCOUNTER — Ambulatory Visit (INDEPENDENT_AMBULATORY_CARE_PROVIDER_SITE_OTHER): Payer: Medicare Other | Admitting: Cardiology

## 2014-07-01 ENCOUNTER — Encounter: Payer: Self-pay | Admitting: Cardiology

## 2014-07-01 VITALS — BP 140/80 | HR 66 | Ht 67.0 in | Wt 152.0 lb

## 2014-07-01 DIAGNOSIS — I359 Nonrheumatic aortic valve disorder, unspecified: Secondary | ICD-10-CM

## 2014-07-01 DIAGNOSIS — R7301 Impaired fasting glucose: Secondary | ICD-10-CM

## 2014-07-01 DIAGNOSIS — I10 Essential (primary) hypertension: Secondary | ICD-10-CM

## 2014-07-01 DIAGNOSIS — I428 Other cardiomyopathies: Secondary | ICD-10-CM

## 2014-07-01 DIAGNOSIS — I420 Dilated cardiomyopathy: Secondary | ICD-10-CM

## 2014-07-01 DIAGNOSIS — I350 Nonrheumatic aortic (valve) stenosis: Secondary | ICD-10-CM

## 2014-07-01 DIAGNOSIS — Z952 Presence of prosthetic heart valve: Secondary | ICD-10-CM

## 2014-07-01 DIAGNOSIS — Z954 Presence of other heart-valve replacement: Secondary | ICD-10-CM

## 2014-07-01 DIAGNOSIS — I2510 Atherosclerotic heart disease of native coronary artery without angina pectoris: Secondary | ICD-10-CM

## 2014-07-01 LAB — HEMOGLOBIN A1C
Hgb A1c MFr Bld: 7.1 % — ABNORMAL HIGH (ref <5.7)
Mean Plasma Glucose: 157 mg/dL — ABNORMAL HIGH (ref <117)

## 2014-07-01 LAB — BASIC METABOLIC PANEL
BUN: 26 mg/dL — AB (ref 6–23)
CO2: 21 mEq/L (ref 19–32)
Calcium: 8.8 mg/dL (ref 8.4–10.5)
Chloride: 109 mEq/L (ref 96–112)
Creat: 1.19 mg/dL (ref 0.50–1.35)
Glucose, Bld: 116 mg/dL — ABNORMAL HIGH (ref 70–99)
POTASSIUM: 4.5 meq/L (ref 3.5–5.3)
Sodium: 140 mEq/L (ref 135–145)

## 2014-07-01 LAB — LIPID PANEL
CHOL/HDL RATIO: 4.5 ratio
Cholesterol: 163 mg/dL (ref 0–200)
HDL: 36 mg/dL — AB (ref >39)
LDL CALC: 104 mg/dL — AB (ref 0–99)
Triglycerides: 115 mg/dL (ref <150)
VLDL: 23 mg/dL (ref 0–40)

## 2014-07-01 MED ORDER — SIMVASTATIN 20 MG PO TABS
20.0000 mg | ORAL_TABLET | Freq: Every day | ORAL | Status: DC
Start: 2014-07-01 — End: 2016-10-24

## 2014-07-01 MED ORDER — SPIRONOLACTONE 25 MG PO TABS: 25.0000 mg | ORAL_TABLET | Freq: Every day | ORAL | Status: DC

## 2014-07-01 MED ORDER — LOSARTAN POTASSIUM 100 MG PO TABS: 100.0000 mg | ORAL_TABLET | Freq: Every day | ORAL | Status: DC

## 2014-07-01 NOTE — Patient Instructions (Signed)
We will recheck your blood work today.  Continue your current therapy

## 2014-07-02 NOTE — Progress Notes (Signed)
Christopher LeedsWilliam Mann Date of Birth: Oct 20, 1930   History of Present Illness: [name] is seen today for follow up. He has a known history of valvular heart disease. He is status post redo aortic valve replacement in 1996 with a mechanical prosthesis. He also has a history of coronary disease with a remote coronary stent. He has a history of dilated cardiomyopathy with ejection fraction of 25-30% by echocardiogram in September of 2012. He is on chronic Coumadin therapy. On followup today he reports he is doing very well. He is very active. He denies dyspnea, increase in edema or orthopnea. He denies any chest pain or palpitations. He is intolerant of statins. On his last visit blood sugar elevated to 237. Has not seen primary care as requested. States he changed diet and lost 8 lbs. Is avoiding sweets. Wants blood sugar retested.   Current Outpatient Prescriptions on File Prior to Visit  Medication Sig Dispense Refill  . Omega-3 Fatty Acids (FISH OIL) 1000 MG CAPS Take 2 tablets( 2000 mg) twice a day  100 capsule  6  . OVER THE COUNTER MEDICATION Cholesterol medication      . Saw Palmetto, Serenoa repens, (SAW PALMETTO PO) Take by mouth.        . Tamsulosin HCl (FLOMAX) 0.4 MG CAPS Take 1 capsule (0.4 mg total) by mouth daily.  120 capsule  6  . warfarin (COUMADIN) 3 MG tablet Take 3 mg by mouth daily. Take as Directed        No current facility-administered medications on file prior to visit.    No Known Allergies  Past Medical History  Diagnosis Date  . Aortic stenosis     Status pst redo aortic valve replacement in 1996 with mechanical prosthesis  . Coronary artery disease 05-1995    Status post stent  . Bundle branch block, left   . Hypertension   . Hypercholesterolemia   . Dilated cardiomyopathy   . Elevated fasting blood sugar     Past Surgical History  Procedure Laterality Date  . Aortic valve replacement  1996    History  Smoking status  . Former Smoker  Smokeless  tobacco  . Not on file    History  Alcohol Use No    History reviewed. No pertinent family history.  Review of Systems: As noted in history of present illness. All other systems were reviewed and are negative.  Physical Exam: BP 140/80  Pulse 66  Ht 5\' 7"  (1.702 m)  Wt 152 lb (68.947 kg)  BMI 23.80 kg/m2 The patient is alert and oriented x 3.    The skin is warm and dry.  Color is normal.  The HEENT exam reveals that the sclera are nonicteric.  The mucous membranes are moist.  The carotids are 2+ without bruits.  There is no thyromegaly.  There is no JVD.  The lungs are clear.   The heart exam reveals a regular rate with a good mechanical aortic valve click.  There are no murmurs, gallops, or rubs.  The PMI is not displaced.   Abdominal exam reveals good bowel sounds.    There is no hepatosplenomegaly or tenderness.  There are no masses.  Exam of the legs reveal no clubbing, cyanosis, or edema.   The distal pulses are intact.  Cranial nerves II - XII are intact.  Motor and sensory functions are intact.  The gait is normal.  LABORATORY DATA: Lab Results  Component Value Date   GLUCOSE 116* 07/01/2014  CHOL 163 07/01/2014   TRIG 115 07/01/2014   HDL 36* 07/01/2014   LDLDIRECT 109.7 12/11/2013   LDLCALC 104* 07/01/2014   ALT 14 04/27/2014   AST 25 04/27/2014   NA 140 07/01/2014   K 4.5 07/01/2014   CL 109 07/01/2014   CREATININE 1.19 07/01/2014   BUN 26* 07/01/2014   CO2 21 07/01/2014   HGBA1C 7.1* 07/01/2014      Assessment / Plan: 1. Chronic systolic CHF with dilated cardiomyopathy. Ejection fraction of 25-30%. Patient is asymptomatic. Continue treatment with ARB, metoprolol, and Aldactone.  2. Status post redo aortic valve replacement with mechanical prosthesis. Functioning well by echocardiogram in September 2012.  3. Hypertension, controlled.  4. Coronary disease with remote coronary stent. Asymptomatic.  5. Hyperlipidemia. Intolerant to statin therapy. Taking OTC plant sterol  esters. Lipids are fair.  4. Coronary disease with remote coronary stent.  5. DM A1c 7.1%. Glucose improved with diet. He should follow up with primary care.

## 2014-07-03 ENCOUNTER — Telehealth: Payer: Self-pay | Admitting: Cardiology

## 2014-07-03 NOTE — Telephone Encounter (Signed)
Returned call to patient no answer.LMTC. 

## 2014-07-03 NOTE — Telephone Encounter (Signed)
Please call,concerning his glucose.

## 2014-07-07 ENCOUNTER — Telehealth: Payer: Self-pay | Admitting: Cardiology

## 2014-07-07 NOTE — Telephone Encounter (Signed)
Returned call to patient's wife she stated husband stopped simvastatin due to back pain.Will let Dr.Jordan know and call her back.

## 2014-07-07 NOTE — Telephone Encounter (Signed)
Pt would like a copy of his Cholesterol results,please mail to him. She also would like to talk to you.

## 2014-07-07 NOTE — Telephone Encounter (Signed)
See previous 07/07/14 note.

## 2014-07-13 NOTE — Telephone Encounter (Signed)
Returned call to patient's wife 07/10/14 Dr.Jordan advised ok to stop simvastatin due to back pain.Advised to follow diet.

## 2014-07-15 ENCOUNTER — Ambulatory Visit: Payer: No Typology Code available for payment source | Admitting: Cardiology

## 2015-01-15 ENCOUNTER — Encounter: Payer: Self-pay | Admitting: Cardiology

## 2015-01-15 ENCOUNTER — Ambulatory Visit (INDEPENDENT_AMBULATORY_CARE_PROVIDER_SITE_OTHER): Payer: Medicare Other | Admitting: Cardiology

## 2015-01-15 VITALS — BP 134/62 | HR 59 | Ht 68.0 in | Wt 150.7 lb

## 2015-01-15 DIAGNOSIS — I2510 Atherosclerotic heart disease of native coronary artery without angina pectoris: Secondary | ICD-10-CM

## 2015-01-15 DIAGNOSIS — I350 Nonrheumatic aortic (valve) stenosis: Secondary | ICD-10-CM

## 2015-01-15 DIAGNOSIS — I420 Dilated cardiomyopathy: Secondary | ICD-10-CM

## 2015-01-15 DIAGNOSIS — E7800 Pure hypercholesterolemia, unspecified: Secondary | ICD-10-CM

## 2015-01-15 DIAGNOSIS — Z954 Presence of other heart-valve replacement: Secondary | ICD-10-CM

## 2015-01-15 DIAGNOSIS — R7301 Impaired fasting glucose: Secondary | ICD-10-CM

## 2015-01-15 DIAGNOSIS — Z952 Presence of prosthetic heart valve: Secondary | ICD-10-CM

## 2015-01-15 DIAGNOSIS — I447 Left bundle-branch block, unspecified: Secondary | ICD-10-CM

## 2015-01-15 LAB — BASIC METABOLIC PANEL
BUN: 35 mg/dL — AB (ref 6–23)
CALCIUM: 9.2 mg/dL (ref 8.4–10.5)
CHLORIDE: 109 meq/L (ref 96–112)
CO2: 22 mEq/L (ref 19–32)
CREATININE: 1.53 mg/dL — AB (ref 0.50–1.35)
Glucose, Bld: 101 mg/dL — ABNORMAL HIGH (ref 70–99)
Potassium: 4.6 mEq/L (ref 3.5–5.3)
Sodium: 140 mEq/L (ref 135–145)

## 2015-01-15 LAB — HEPATIC FUNCTION PANEL
ALK PHOS: 49 U/L (ref 39–117)
ALT: 12 U/L (ref 0–53)
AST: 18 U/L (ref 0–37)
Albumin: 3.9 g/dL (ref 3.5–5.2)
BILIRUBIN DIRECT: 0.1 mg/dL (ref 0.0–0.3)
BILIRUBIN TOTAL: 0.5 mg/dL (ref 0.2–1.2)
Indirect Bilirubin: 0.4 mg/dL (ref 0.2–1.2)
Total Protein: 6 g/dL (ref 6.0–8.3)

## 2015-01-15 LAB — LIPID PANEL
CHOL/HDL RATIO: 3.9 ratio
Cholesterol: 143 mg/dL (ref 0–200)
HDL: 37 mg/dL — ABNORMAL LOW (ref >=40)
LDL Cholesterol: 89 mg/dL (ref 0–99)
TRIGLYCERIDES: 84 mg/dL (ref <150)
VLDL: 17 mg/dL (ref 0–40)

## 2015-01-15 NOTE — Progress Notes (Signed)
Christopher Mann Date of Birth: 09-29-1930   History of Present Illness: Mr. Crute is seen for follow up CHF and AVR. He has a known history of valvular heart disease. He is status post redo aortic valve replacement in 1996 with a mechanical prosthesis. He also has a history of coronary disease with a remote coronary stent. He has a history of dilated cardiomyopathy with ejection fraction of 25-30% by echocardiogram in September of 2012. He is on chronic Coumadin therapy. On followup today he reports he is doing very well. He is  active. He denies dyspnea, increase in edema or orthopnea. He denies any chest pain or palpitations. He has made changes with his diet and cut out sweets. He has lost weight and am blood sugars are 100-115. He is back on Zocor now after previously trying plant sterol esters  Current Outpatient Prescriptions on File Prior to Visit  Medication Sig Dispense Refill  . losartan (COZAAR) 100 MG tablet Take 1 tablet (100 mg total) by mouth daily. 90 tablet 3  . Saw Palmetto, Serenoa repens, (SAW PALMETTO PO) Take by mouth.      . simvastatin (ZOCOR) 20 MG tablet Take 1 tablet (20 mg total) by mouth at bedtime. 90 tablet 3  . spironolactone (ALDACTONE) 25 MG tablet Take 1 tablet (25 mg total) by mouth daily. 90 tablet 3  . Tamsulosin HCl (FLOMAX) 0.4 MG CAPS Take 1 capsule (0.4 mg total) by mouth daily. (Patient taking differently: Take 0.4 mg by mouth daily as needed. ) 120 capsule 6  . warfarin (COUMADIN) 3 MG tablet Take 3 mg by mouth daily. Take as Directed      No current facility-administered medications on file prior to visit.    No Known Allergies  Past Medical History  Diagnosis Date  . Aortic stenosis     Status pst redo aortic valve replacement in 1996 with mechanical prosthesis  . Coronary artery disease 05-1995    Status post stent  . Bundle branch block, left   . Hypertension   . Hypercholesterolemia   . Dilated cardiomyopathy   . Elevated fasting blood  sugar     Past Surgical History  Procedure Laterality Date  . Aortic valve replacement  1996    History  Smoking status  . Former Smoker  Smokeless tobacco  . Not on file    History  Alcohol Use No    History reviewed. No pertinent family history.  Review of Systems: As noted in history of present illness. All other systems were reviewed and are negative.  Physical Exam: BP 134/62 mmHg  Pulse 59  Ht 5\' 8"  (1.727 m)  Wt 150 lb 11.2 oz (68.357 kg)  BMI 22.92 kg/m2 The patient is alert and oriented x 3.    The skin is warm and dry.  Color is normal.  The HEENT exam is normal. The carotids are2+ without bruits.   There is no thyromegaly.  There is no JVD.  The lungs are clear.   The heart exam reveals a regular rate with a good mechanical aortic valve click.  There are no murmurs, gallops, or rubs.  The PMI is not displaced.   Abdominal exam reveals good bowel sounds.    There is no hepatosplenomegaly or tenderness.  There are no masses.  Exam of the legs reveal no clubbing, cyanosis, or edema.   The distal pulses are intact.  Cranial nerves II - XII are intact.  Motor and sensory functions are intact.  The gait is normal.  LABORATORY DATA: Lab Results  Component Value Date   GLUCOSE 116* 07/01/2014   CHOL 163 07/01/2014   TRIG 115 07/01/2014   HDL 36* 07/01/2014   LDLDIRECT 109.7 12/11/2013   LDLCALC 104* 07/01/2014   ALT 14 04/27/2014   AST 25 04/27/2014   NA 140 07/01/2014   K 4.5 07/01/2014   CL 109 07/01/2014   CREATININE 1.19 07/01/2014   BUN 26* 07/01/2014   CO2 21 07/01/2014   HGBA1C 7.1* 07/01/2014    Ecg today shows NSR with first degree AV block. LBBB. No acute change. I have personally reviewed and interpreted this study.   Assessment / Plan: 1. Chronic systolic CHF with dilated cardiomyopathy. Ejection fraction of 25-30%. Patient is asymptomatic. Continue treatment with ARB, metoprolol, and Aldactone.  2. Status post redo aortic valve replacement  with mechanical prosthesis. Functioning well by echocardiogram in September 2012. Exam is normal.   3. Hypertension, controlled.  4. Coronary disease with remote coronary stent. Asymptomatic.  5. Hyperlipidemia. Now back on statin. Will check lipids and chemistries today.  6. DM last A1c 7.1%. Glucose improved with diet. Will repeat A1c today.  Follow up in 6 months.

## 2015-01-15 NOTE — Patient Instructions (Signed)
Continue your current therapy  We will check lab work today  I will see you in 6 months   

## 2015-01-16 LAB — HEMOGLOBIN A1C
HEMOGLOBIN A1C: 6.4 % — AB (ref <5.7)
Mean Plasma Glucose: 137 mg/dL — ABNORMAL HIGH (ref <117)

## 2015-02-23 ENCOUNTER — Telehealth: Payer: Self-pay

## 2015-02-23 NOTE — Telephone Encounter (Signed)
Received a call from patient's wife she wanted to know why Dr.Jordan wants patient to see a urologist.Stated she thinks it is for ED,but she wanted to make sure.Advised I will ask Dr.Jordan and and will call her back if something more than ED.

## 2016-02-02 ENCOUNTER — Encounter: Payer: Self-pay | Admitting: Cardiology

## 2016-02-02 ENCOUNTER — Ambulatory Visit (INDEPENDENT_AMBULATORY_CARE_PROVIDER_SITE_OTHER): Payer: Medicare Other | Admitting: Cardiology

## 2016-02-02 VITALS — BP 132/64 | HR 62 | Ht 68.0 in | Wt 138.9 lb

## 2016-02-02 DIAGNOSIS — I447 Left bundle-branch block, unspecified: Secondary | ICD-10-CM | POA: Diagnosis not present

## 2016-02-02 DIAGNOSIS — I2510 Atherosclerotic heart disease of native coronary artery without angina pectoris: Secondary | ICD-10-CM

## 2016-02-02 DIAGNOSIS — Z954 Presence of other heart-valve replacement: Secondary | ICD-10-CM

## 2016-02-02 DIAGNOSIS — I420 Dilated cardiomyopathy: Secondary | ICD-10-CM

## 2016-02-02 DIAGNOSIS — I350 Nonrheumatic aortic (valve) stenosis: Secondary | ICD-10-CM

## 2016-02-02 DIAGNOSIS — Z952 Presence of prosthetic heart valve: Secondary | ICD-10-CM

## 2016-02-02 NOTE — Progress Notes (Signed)
Christopher Mann Date of Birth: May 24, 1930   History of Present Illness: Christopher Mann is seen for follow up CHF and AVR. He has a known history of valvular heart disease. He is status post redo aortic valve replacement in 1996 with a mechanical prosthesis at [facility]. He also has a history of coronary disease with a remote coronary stent. He has a history of dilated cardiomyopathy with ejection fraction of 25-30% by echocardiogram in September of 2012. He is on chronic Coumadin therapy.  On followup today he reports he is doing very well from a cardiac standpoint. He is  active. He denies dyspnea, increase in edema or orthopnea. He denies any chest pain or palpitations. He has lost another 12 lbs. He does complain of a 2 week history of cough with production of white phlegm. No fever or chills. Now on doxycycline and Mucinex. Has some sinus drainage as well.   Current Outpatient Prescriptions on File Prior to Visit  Medication Sig Dispense Refill  . losartan (COZAAR) 100 MG tablet Take 1 tablet (100 mg total) by mouth daily. 90 tablet 3  . Saw Palmetto, Serenoa repens, (SAW PALMETTO PO) Take by mouth.      . simvastatin (ZOCOR) 20 MG tablet Take 1 tablet (20 mg total) by mouth at bedtime. 90 tablet 3  . spironolactone (ALDACTONE) 25 MG tablet Take 1 tablet (25 mg total) by mouth daily. 90 tablet 3  . Tamsulosin HCl (FLOMAX) 0.4 MG CAPS Take 1 capsule (0.4 mg total) by mouth daily. (Patient taking differently: Take 0.4 mg by mouth daily as needed. ) 120 capsule 6  . warfarin (COUMADIN) 3 MG tablet Take 3 mg by mouth daily. Take as Directed      No current facility-administered medications on file prior to visit.    No Known Allergies  Past Medical History  Diagnosis Date  . Aortic stenosis     Status pst redo aortic valve replacement in 1996 with mechanical prosthesis  . Coronary artery disease 05-1995    Status post stent  . Bundle branch block, left   . Hypertension   .  Hypercholesterolemia   . Dilated cardiomyopathy (HCC)   . Elevated fasting blood sugar     Past Surgical History  Procedure Laterality Date  . Aortic valve replacement  1996    History  Smoking status  . Former Smoker  Smokeless tobacco  . Not on file    History  Alcohol Use No    History reviewed. No pertinent family history.  Review of Systems: As noted in history of present illness. All other systems were reviewed and are negative.  Physical Exam: BP 132/64 mmHg  Pulse 62  Ht  (1.727 m)  Wt 63.005 kg (138 lb 14.4 oz)  BMI 21.12 kg/m2 The patient is alert and oriented x 3.    The skin is warm and dry.  Color is normal.  The HEENT exam is normal. The carotids are 2+ without bruits.   There is no thyromegaly.  There is no JVD.  The lungs are clear.   The heart exam reveals a regular rate with a good mechanical aortic valve click.  There are no murmurs, gallops, or rubs.  The PMI is not displaced.   Abdominal exam reveals good bowel sounds.    There is no hepatosplenomegaly or tenderness.  There are no masses.  Exam of the legs reveal no clubbing, cyanosis, or edema.   The distal pulses are intact.  Cranial  nerves II - XII are intact.  Motor and sensory functions are intact.  The gait is normal.  LABORATORY DATA: Lab Results  Component Value Date   GLUCOSE 101* 01/15/2015   CHOL 143 01/15/2015   TRIG 84 01/15/2015   HDL 37* 01/15/2015   LDLDIRECT 109.7 12/11/2013   LDLCALC 89 01/15/2015   ALT 12 01/15/2015   AST 18 01/15/2015   NA 140 01/15/2015   K 4.6 01/15/2015   CL 109 01/15/2015   CREATININE 1.53* 01/15/2015   BUN 35* 01/15/2015   CO2 22 01/15/2015   HGBA1C 6.4* 01/15/2015    Ecg today shows NSR with first degree AV block. Rate 62,  LBBB. No acute change. I have personally reviewed and interpreted this study.  Labs reviewed from primary care. Findings:  INR on 01/27/16- 2.5 12/27/15: cholesterol 170, triglycerides 310, HDL 31, LDL 62. 08/03/15: CBC  normal. BUN 31, creatinine 1.36. Potassium 4.3. Other chemistries OK. TSH. 4.68.   Assessment / Plan: 1. Chronic systolic CHF with dilated cardiomyopathy. Ejection fraction of 25-30%. Patient is asymptomatic. Continue treatment with ARB and Aldactone. Will update Echo at this time.  2. Status post redo aortic valve replacement with mechanical prosthesis. Functioning well by echocardiogram in September 2012. Exam is normal.   3. Hypertension, controlled.  4. Coronary disease with remote coronary stent. Asymptomatic.  5. Hyperlipidemia. Now back on statin. LDL OK. Triglycerides should improve with weight loss.  6. DM last A1c 6.2%. Glucose improved with diet.   7. Bronchitis. Probably viral. Complete doxycycline course. Take Mucinex and antihistamine as needed.   Follow up in 6 months.  Follow up in 6 months.

## 2016-02-02 NOTE — Patient Instructions (Signed)
Continue your current therapy  We will schedule you for an Echocardiogram  I will see you 6 months.

## 2016-02-02 NOTE — Addendum Note (Signed)
Addended by: Neoma Laming on: 02/02/2016 12:18 PM   Modules accepted: Orders

## 2016-02-07 ENCOUNTER — Telehealth: Payer: Self-pay | Admitting: Cardiology

## 2016-02-07 NOTE — Telephone Encounter (Signed)
New Message  Pt wife called request immediately   Pt states that her wife states that cheryl will know what's going on. He was sleepy and slept most of the day. She would like a same day appt.or next day appt. No further details per pt. Please call

## 2016-02-07 NOTE — Telephone Encounter (Signed)
Returned call to patient's wife.Advised Dr.Jordan has been in hospital today.He has not responded to message at present.Advised if husband has any more symptoms of not acting right and feet not working he will need to go to ER tonight.Advised I'am off 02/08/16.I spoke to triage nurse Burt Knack RN and she will call you back tomorrow with Dr.Jordan's recommendations.

## 2016-02-07 NOTE — Telephone Encounter (Signed)
F/u  Pt requested to speak w/ RN- Please call back and discuss.   

## 2016-02-07 NOTE — Telephone Encounter (Signed)
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.He had a episode this past Sat while bowling feet did not want to work right,could not walk straight.Stated yesterday in church he fell asleep and that is not like him.Stated they went out to eat lunch with friends and he did not act his self did not talk like he normally does.This morning he went bowling again and same problem feet did not want to work right.Stated he seems more lethargic.Stated he has sob with exertion.No chest pain.Stated seems like his mind is not working right.No slurred speech.No weakness noticed in body,just having problem with feet not walking straight.Stated he just got home seems ok at present.Stated she would like him to have echo done sooner.Stated she would like appointment with Dr.Jordan this week.Advised Dr.Jordan's schedule is full.Advised I can make him appointment with extender.Stated she wanted to ask Dr.Jordan.Advised I will send message to him for advice.

## 2016-02-08 ENCOUNTER — Telehealth: Payer: Self-pay | Admitting: Cardiology

## 2016-02-08 NOTE — Telephone Encounter (Signed)
LEFT MESSAGE TO CAL BACK 

## 2016-02-08 NOTE — Telephone Encounter (Signed)
We can schedule for Echo sooner if schedule allows. His symptoms do not sound cardiac to me. I wonder if he has had some neurologic event. I cannot see him sooner. I am overbooked this week and rounding next. He can either see an extender or follow up with his primary care.  Peter Swaziland MD, [facility]

## 2016-02-08 NOTE — Telephone Encounter (Signed)
SPOKE TO WIFE  INFORMATION GIVEN. THE FIRST AVAILABLE ECHO APPOINTMENT IS 02/17/16 WIFE ASSISTED PATIENT TO BE SEEN BY AN EXTENDER. APPOINTMENT SCHEDULE FOR 02/09/16 AT 11 AM AT [facility] OFFICE.Marland Kitchen WIFE VERBALIZED UNDERSTANDING.

## 2016-02-08 NOTE — Telephone Encounter (Signed)
New Message  Pt called request an appt with Dr. Swaziland 02/09/2016 after 2p Offered her the wait list she states that she would like to speak with the nurse sharon to see if she can get her in sooner. Please assist

## 2016-02-09 ENCOUNTER — Ambulatory Visit: Payer: Medicare Other | Admitting: Cardiology

## 2016-02-10 NOTE — Telephone Encounter (Signed)
Spoke to patient's wife.She stated husband saw PCP yesterday 02/09/16.Stated PCP advised husband may have had a TIA.Advised to see a neurologist.Stated husband has been doing good no more symptoms.Stated he will have Echo 02/17/16.Stated she would like to schedule appointment with Dr.Jordan.Appointment scheduled 05/11/16 at 8:30 am.Stated husband wanted to wait and have echo before he scheduled appointment with neurologist.Advised I will let Dr.Jordan know.

## 2016-02-17 ENCOUNTER — Ambulatory Visit (HOSPITAL_COMMUNITY): Payer: Medicare Other | Attending: Cardiology

## 2016-02-17 ENCOUNTER — Other Ambulatory Visit: Payer: Self-pay

## 2016-02-17 DIAGNOSIS — E785 Hyperlipidemia, unspecified: Secondary | ICD-10-CM | POA: Insufficient documentation

## 2016-02-17 DIAGNOSIS — I447 Left bundle-branch block, unspecified: Secondary | ICD-10-CM

## 2016-02-17 DIAGNOSIS — I351 Nonrheumatic aortic (valve) insufficiency: Secondary | ICD-10-CM | POA: Diagnosis not present

## 2016-02-17 DIAGNOSIS — I340 Nonrheumatic mitral (valve) insufficiency: Secondary | ICD-10-CM | POA: Diagnosis not present

## 2016-02-17 DIAGNOSIS — I350 Nonrheumatic aortic (valve) stenosis: Secondary | ICD-10-CM | POA: Insufficient documentation

## 2016-02-17 DIAGNOSIS — Z952 Presence of prosthetic heart valve: Secondary | ICD-10-CM | POA: Diagnosis not present

## 2016-02-17 DIAGNOSIS — I2510 Atherosclerotic heart disease of native coronary artery without angina pectoris: Secondary | ICD-10-CM | POA: Diagnosis not present

## 2016-02-17 DIAGNOSIS — I420 Dilated cardiomyopathy: Secondary | ICD-10-CM

## 2016-02-17 DIAGNOSIS — I371 Nonrheumatic pulmonary valve insufficiency: Secondary | ICD-10-CM | POA: Diagnosis not present

## 2016-02-17 DIAGNOSIS — I77810 Thoracic aortic ectasia: Secondary | ICD-10-CM | POA: Diagnosis not present

## 2016-02-17 DIAGNOSIS — I119 Hypertensive heart disease without heart failure: Secondary | ICD-10-CM | POA: Diagnosis not present

## 2016-02-17 DIAGNOSIS — Z87891 Personal history of nicotine dependence: Secondary | ICD-10-CM | POA: Insufficient documentation

## 2016-02-17 DIAGNOSIS — Z954 Presence of other heart-valve replacement: Secondary | ICD-10-CM

## 2016-02-18 ENCOUNTER — Other Ambulatory Visit (HOSPITAL_COMMUNITY): Payer: Medicare Other

## 2016-02-22 ENCOUNTER — Telehealth: Payer: Self-pay | Admitting: Cardiology

## 2016-02-22 NOTE — Telephone Encounter (Signed)
Returned call to patient's wife echo results given.

## 2016-02-22 NOTE — Telephone Encounter (Signed)
Pt would like echo results from last Thursday please. °

## 2016-05-11 ENCOUNTER — Ambulatory Visit: Payer: Medicare Other | Admitting: Cardiology

## 2016-10-23 NOTE — Progress Notes (Signed)
Cardiology Office Note    Date:  10/24/2016   ID:  Christopher Mann, DOB 29-Feb-1930, MRN [id number]  PCP:  Vivien PrestoORRINGTON,KIP A, MD  Cardiologist: Dr. [place]ilandJordan   Chief Complaint  Patient presents with  . Follow-up    pt c/o dizziness    History of Present Illness:    Christopher Mann is a 82 y.o. male with past medical history of severe AS (s/p redo AVR with mechanical prosthesis in 1996, on Coumadin), CAD (s/p PCI in 1996),chronic systolic CHF (EF 42-59%20-25% by echo in 02/2016), HTN, and HLD who presents to the office today for his 6840-month follow-up and evaluation of dizziness.   He was last seen by Dr. [place]ilandJordan in 01/2016 and reported doing well from a cardiac perspective, denying any chest discomfort or dyspnea with exertion. Was suffering from viral bronchitis at that time.    In talking with the patient today, he reports having an episode of dizziness and vomiting occurring this past Sunday, 10/22/2016. Says his granddaughter had recently visited and was diagnosed with viral gastroenteritis. He experienced one episode of vomiting after consuming breakfast, then developed dizziness. This resolved within less than 30 minutes. He denies any associated chest discomfort, palpitations, dyspnea, or presyncope. There was initial concern of hypoglycemia but his glucose reading at that time was 112. Upon his symptoms resolving, he carried out his daily activities without difficulty.  Since then, he has reported feeling well. He denies any recurrent episodes. Reports having a good appetite. Has his INR checked by his PCP. Reports this has been within a normal range. Denies any evidence of hematochezia or melena. No active bleeding.  Reports good compliance with his blood pressure medications, saying his systolic readings are usually in the 120's. He did stop taking his Simvastatin several months ago due to the side effect of myalgias. Says he is taking an over-the-counter medication which is not fish oil, and  is unsure of the name of this.   Past Medical History:  Diagnosis Date  . Aortic stenosis    a. s/p redo aortic valve replacement in 1996 with mechanical prosthesis. On Coumadin  . Bundle branch block, left   . Chronic combined systolic and diastolic CHF (congestive heart failure) (HCC)    a. 2012: Echo showing EF of 25-30% b. 02/2016: Echo with EF of 20-25%, Grade 2 DD, mechanical AV functioning normally, PA Pressure 37 mm Hg  . Coronary artery disease 05/1995   a. s/p PCI with stent placement in 1996.  . Dilated cardiomyopathy (HCC)   . Elevated fasting blood sugar   . Hypercholesterolemia   . Hypertension     Past Surgical History:  Procedure Laterality Date  . AORTIC VALVE REPLACEMENT  1996    Current Medications: Outpatient Medications Prior to Visit  Medication Sig Dispense Refill  . losartan (COZAAR) 100 MG tablet Take 1 tablet (100 mg total) by mouth daily. 90 tablet 3  . Saw Palmetto, Serenoa repens, (SAW PALMETTO PO) Take by mouth.      . spironolactone (ALDACTONE) 25 MG tablet Take 1 tablet (25 mg total) by mouth daily. 90 tablet 3  . tamsulosin (FLOMAX) 0.4 MG CAPS capsule Take 0.4 mg by mouth daily as needed.    . warfarin (COUMADIN) 3 MG tablet Take 3 mg by mouth daily. Take as Directed     . doxycycline (VIBRA-TABS) 100 MG tablet Take 100 mg by mouth 2 (two) times daily.    . simvastatin (ZOCOR) 20 MG tablet Take 1 tablet (20  mg total) by mouth at bedtime. (Patient not taking: Reported on 10/24/2016) 90 tablet 3   No facility-administered medications prior to visit.      Allergies:   Patient has no known allergies.   Social History   Social History  . Marital status: Married    Spouse name: N/A  . Number of children: N/A  . Years of education: N/A   Social History Main Topics  . Smoking status: Former Games developer  . Smokeless tobacco: Never Used  . Alcohol use No  . Drug use: No  . Sexual activity: Not Asked   Other Topics Concern  . None   Social  History Narrative  . None     Family History:  The patient's family history includes CAD in his father.   Review of Systems:   Please see the history of present illness.     General:  No chills, fever, night sweats or weight changes.  Cardiovascular:  No chest pain, dyspnea on exertion, edema, orthopnea, palpitations, paroxysmal nocturnal dyspnea. Dermatological: No rash, lesions/masses Respiratory: No cough, dyspnea Urologic: No hematuria, dysuria Abdominal:   No nausea, diarrhea, bright red blood per rectum, melena, or hematemesis. Positive for vomiting.  Neurologic:  No visual changes, wkns, changes in mental status. Positive for dizziness.  All other systems reviewed and are otherwise negative except as noted above.   Physical Exam:    VS:  BP 122/64   Pulse (!) 55   Ht 5\' 8"  (1.727 m)   Wt 142 lb 6.4 oz (64.6 kg)   BMI 21.65 kg/m    General: Well developed, well nourished elderly Caucasian male appearing in no acute distress. Head: Normocephalic, atraumatic, sclera non-icteric, no xanthomas, nares are without discharge.  Neck: No carotid bruits. JVD not elevated.  Lungs: Respirations regular and unlabored, without wheezes or rales.  Heart: Regular rate and rhythm. No S3 or S4.  No murmur, no rubs, or gallops appreciated. Crisp valve sounds appreciated.  Abdomen: Soft, non-tender, non-distended with normoactive bowel sounds. No hepatomegaly. No rebound/guarding. No obvious abdominal masses. Msk:  Strength and tone appear normal for age. No joint deformities or effusions. Extremities: No clubbing or cyanosis. No edema.  Distal pedal pulses are 2+ bilaterally. Neuro: Alert and oriented X 3. Moves all extremities spontaneously. No focal deficits noted. Psych:  Responds to questions appropriately with a normal affect. Skin: No rashes or lesions noted  Wt Readings from Last 3 Encounters:  10/24/16 142 lb 6.4 oz (64.6 kg)  02/02/16 138 lb 14.4 oz (63 kg)  01/15/15 150 lb 11.2  oz (68.4 kg)    Studies/Labs Reviewed:   EKG:  EKG is ordered today.  The ekg ordered today demonstrates sinus bradycardia, HR 55 with 1st degree AV Block. Known LBBB.   Recent Labs: No results found for requested labs within last 8760 hours.   Lipid Panel    Component Value Date/Time   CHOL 143 01/15/2015 0900   TRIG 84 01/15/2015 0900   HDL 37 (L) 01/15/2015 0900   CHOLHDL 3.9 01/15/2015 0900   VLDL 17 01/15/2015 0900   LDLCALC 89 01/15/2015 0900   LDLDIRECT 109.7 12/11/2013 1045    Additional studies/ records that were reviewed today include:   Echocardiogram: 02/2016 Study Conclusions  - Left ventricle: The cavity size was severely dilated. Systolic   function was severely reduced. The estimated ejection fraction   was in the range of 20% to 25%. Severe diffuse hypokinesis.   Features are consistent with a pseudonormal  left ventricular   filling pattern, with concomitant abnormal relaxation and   increased filling pressure (grade 2 diastolic dysfunction).   Doppler parameters are consistent with high ventricular filling   pressure. - Ventricular septum: Septal motion showed moderate paradox. These   changes are consistent with intraventricular conduction delay. - Aortic valve: A mechanical prosthesis was present and functioning   normally. There was trivial regurgitation. - Aorta: Ascending aortic diameter: 51 mm (S). - Ascending aorta: The ascending aorta was moderately dilated. - Mitral valve: Calcified annulus. Mild diffuse thickening and   calcification. There was mild regurgitation. - Left atrium: The atrium was severely dilated. - Pulmonic valve: There was trivial regurgitation. - Pulmonary arteries: PA peak pressure: 37 mm Hg (S).  Impressions:  - The right ventricular systolic pressure was increased consistent   with mild pulmonary hypertension.  Assessment:    1. Dizziness   2. Severe aortic valve stenosis   3. Chronic combined systolic and  diastolic CHF (congestive heart failure) (HCC)   4. Essential hypertension   5. Hypercholesterolemia   6. Bundle branch block, left      Plan:   In order of problems listed above:  1. Dizziness - reports having one episode of dizziness occurring after a vomiting spell this past Sunday (10/22/2016). His granddaughter had recently visited and experienced similar symptoms, being diagnosed with viral gastroenteritis. His symptoms resolved within 30 minutes. He denies any repeat episodes since. No associated chest discomfort, palpitations, dyspnea, or presyncope. - No recent episodes of hypoglycemia, hypotension, or significant bradycardia. - Overall, his symptoms of dizziness were most likely secondary to mild hypovolemia with his rece nt episode of emesis. Would not pursue further testing at this time with his resolved symptoms.   2. Severe Aortic Stenosis - s/p redo AVR with mechanical prosthesis in 1996 - remains on Coumadin. Denies any evidence of active bleeding.   3. Chronic Combined Systolic and Diastolic CHF - EF 20-25% by echo in 02/2016, with similar readings by echo in 2012 (EF 25-30% at that time).  - does not appear volume overloaded by physical exam - continue Losartan and Spironolactone. Not on BB therapy secondary to baseline bradycardia.    4. Essential HTN - BP well-controlled at 122/64 during today's visit. Reports similar readings at home. - continue Losartan 100mg  daily and Spironolactone 25mg  daily.   5. HLD - stopped Simvastatin 20mg  daily secondary to myalgias. Now on an "OTC medication".  - recheck FLP and LFT's today.   6. LBBB - chronic. No acute changes on current EKG when compared to prior tracings.    Medication Adjustments/Labs and Tests Ordered: Current medicines are reviewed at length with the patient today.  Concerns regarding medicines are outlined above.  Medication changes, Labs and Tests ordered today are listed in the Patient Instructions  below. Patient Instructions  Medication Instructions:  Your physician recommends that you continue on your current medications as directed. Please refer to the Current Medication list given to you today.  Labwork: TODAY:  LIPID PANEL & CMET  Testing/Procedures: None ordered  Follow-Up: Your physician wants you to follow-up in:  SEE DR. Swaziland AS SCHEDULED  Any Other Special Instructions Will Be Listed Below (If Applicable).  If you need a refill on your cardiac medications before your next appointment, please call your pharmacy.    Lorri Frederick, [place]  10/24/2016 12:13 PM    [facility] [address] [place], [address] [place], [place]  [address] Phone: [phone])  [phone]; Fax: [phone]  [address] [place], [place] [address] Phone: [phone]

## 2016-10-24 ENCOUNTER — Ambulatory Visit (INDEPENDENT_AMBULATORY_CARE_PROVIDER_SITE_OTHER): Payer: Medicare Other | Admitting: Student

## 2016-10-24 ENCOUNTER — Encounter: Payer: Self-pay | Admitting: Student

## 2016-10-24 VITALS — BP 122/64 | HR 55 | Ht 68.0 in | Wt 142.4 lb

## 2016-10-24 DIAGNOSIS — I10 Essential (primary) hypertension: Secondary | ICD-10-CM | POA: Diagnosis not present

## 2016-10-24 DIAGNOSIS — I2510 Atherosclerotic heart disease of native coronary artery without angina pectoris: Secondary | ICD-10-CM

## 2016-10-24 DIAGNOSIS — I350 Nonrheumatic aortic (valve) stenosis: Secondary | ICD-10-CM | POA: Diagnosis not present

## 2016-10-24 DIAGNOSIS — R42 Dizziness and giddiness: Secondary | ICD-10-CM

## 2016-10-24 DIAGNOSIS — I5042 Chronic combined systolic (congestive) and diastolic (congestive) heart failure: Secondary | ICD-10-CM | POA: Diagnosis not present

## 2016-10-24 DIAGNOSIS — I447 Left bundle-branch block, unspecified: Secondary | ICD-10-CM

## 2016-10-24 DIAGNOSIS — E7800 Pure hypercholesterolemia, unspecified: Secondary | ICD-10-CM

## 2016-10-24 NOTE — Patient Instructions (Addendum)
Medication Instructions:  Your physician recommends that you continue on your current medications as directed. Please refer to the Current Medication list given to you today.   Labwork: TODAY:  LIPID PANEL & CMET  Testing/Procedures: None ordered  Follow-Up: Your physician wants you to follow-up in:  SEE DR. Sw[place]il[place] AS SCHEDULED  Any Other Special Instructions Will Be Listed Below (If Applicable).   If you need a refill on your cardiac medications before your next appointment, please call your pharmacy.

## 2016-10-25 ENCOUNTER — Telehealth: Payer: Self-pay | Admitting: *Deleted

## 2016-10-25 LAB — COMPREHENSIVE METABOLIC PANEL
ALK PHOS: 49 U/L (ref 40–115)
ALT: 14 U/L (ref 9–46)
AST: 27 U/L (ref 10–35)
Albumin: 4.1 g/dL (ref 3.6–5.1)
BUN: 39 mg/dL — AB (ref 7–25)
CHLORIDE: 108 mmol/L (ref 98–110)
CO2: 26 mmol/L (ref 20–31)
Calcium: 9.3 mg/dL (ref 8.6–10.3)
Creat: 1.45 mg/dL — ABNORMAL HIGH (ref 0.70–1.11)
GLUCOSE: 96 mg/dL (ref 65–99)
POTASSIUM: 4.8 mmol/L (ref 3.5–5.3)
Sodium: 140 mmol/L (ref 135–146)
Total Bilirubin: 0.6 mg/dL (ref 0.2–1.2)
Total Protein: 6.4 g/dL (ref 6.1–8.1)

## 2016-10-25 LAB — LIPID PANEL
Cholesterol: 209 mg/dL — ABNORMAL HIGH (ref <200)
HDL: 38 mg/dL — ABNORMAL LOW (ref >40)
LDL CALC: 143 mg/dL — AB (ref <100)
TRIGLYCERIDES: 141 mg/dL (ref <150)
Total CHOL/HDL Ratio: 5.5 Ratio — ABNORMAL HIGH (ref <5.0)
VLDL: 28 mg/dL (ref <30)

## 2016-10-25 MED ORDER — ROSUVASTATIN CALCIUM 5 MG PO TABS: ORAL_TABLET | ORAL | 3 refills | Status: DC

## 2016-10-25 NOTE — Telephone Encounter (Signed)
-----   Message from Ellsworth Lennox, [place] sent at 10/25/2016 11:02 AM EST ----- Please let the patient know his kidney and liver function remain stable. Cholesterol levels have significantly increased from last year (LDL from 89 --> 143) with his stopping the Simvastatin. Goal would be less than 70. Would recommend trying Crestor 5mg  three times weekly to see if he could tolerate this. Thanks.

## 2016-10-25 NOTE — Telephone Encounter (Signed)
Spoke with mary, pt's wife, DPR on file, and she has been made aware that pts cholesterol level has almost double since last year when he stopped the simvastatin. It has been recommended for pt to try Cretor 5 mg three X's a week. Pts wife said she will let him know what has been recommended and for me to send the rx to [facility] and she will call back if pt is not in agreeance.

## 2016-10-27 ENCOUNTER — Telehealth: Payer: Self-pay | Admitting: Student

## 2016-10-27 NOTE — Telephone Encounter (Signed)
Returned call to patient wife (ok per DPR)-informed patient that it is not necessary to take the OTC cholesterol medication in addition to the Crestor.  Pt wife requesting RN to verify with MD [place].    Routed to MD for verification.  Pt verbalized understanding.    Crestor started after lab work completed on 12/12.

## 2016-10-27 NOTE — Telephone Encounter (Signed)
New message      Pt was recently seen.  He want to know if he is to take his over the counter cholesterol medication (citra colace????) with the crestor?  Please call

## 2016-10-28 NOTE — Telephone Encounter (Signed)
Agree he should not take OTC cholesterol med  Otto Caraway [place] MD, [facility]

## 2016-10-30 ENCOUNTER — Telehealth: Payer: Self-pay | Admitting: Cardiology

## 2016-10-30 NOTE — Telephone Encounter (Signed)
Pt wife notified she will pass onto pt-ok per [facility]

## 2016-10-30 NOTE — Telephone Encounter (Signed)
No vm set up-will need to call back later

## 2016-10-30 NOTE — Telephone Encounter (Signed)
Patient is returning your call.  

## 2016-10-31 NOTE — Telephone Encounter (Signed)
See previous 10/30/16 note.

## 2017-01-13 NOTE — Progress Notes (Deleted)
Cardiology Office Note    Date:  01/13/2017   ID:  Christopher Mann, DOB [birth year], MRN [id number]  PCP:  Vivien Presto, MD  Cardiologist: Dr. [place]   No chief complaint on file.   History of Present Illness:    Christopher Mann is a 83 y.o. male with past medical history of severe AS (s/p redo AVR with mechanical prosthesis in 1996, on Coumadin), CAD (s/p PCI in 1996),chronic systolic CHF (EF 40-98% by echo in 02/2016), HTN, and HLD who presents for follow up.   He was last seen in 01/2016 and reported doing well from a cardiac perspective, denying any chest discomfort or dyspnea with exertion. Was suffering from viral bronchitis at that time.    He was seen in December 2017 with dizziness related to a viral gastroenteritis.    Past Medical History:  Diagnosis Date  . Aortic stenosis    a. s/p redo aortic valve replacement in 1996 with mechanical prosthesis. On Coumadin  . Bundle branch block, left   . Chronic combined systolic and diastolic CHF (congestive heart failure) (HCC)    a. 2012: Echo showing EF of 25-30% b. 02/2016: Echo with EF of 20-25%, Grade 2 DD, mechanical AV functioning normally, PA Pressure 37 mm Hg  . Coronary artery disease 05/1995   a. s/p PCI with stent placement in 1996.  . Dilated cardiomyopathy (HCC)   . Elevated fasting blood sugar   . Hypercholesterolemia   . Hypertension     Past Surgical History:  Procedure Laterality Date  . AORTIC VALVE REPLACEMENT  1996    Current Medications: Outpatient Medications Prior to Visit  Medication Sig Dispense Refill  . losartan (COZAAR) 100 MG tablet Take 1 tablet (100 mg total) by mouth daily. 90 tablet 3  . rosuvastatin (CRESTOR) 5 MG tablet Take 1 tablet by mouth three times a week 36 tablet 3  . Saw Palmetto, Serenoa repens, (SAW PALMETTO PO) Take by mouth.      . spironolactone (ALDACTONE) 25 MG tablet Take 1 tablet (25 mg total) by mouth daily. 90 tablet 3  . tamsulosin (FLOMAX) 0.4 MG CAPS capsule  Take 0.4 mg by mouth daily as needed.    . warfarin (COUMADIN) 3 MG tablet Take 3 mg by mouth daily. Take as Directed      No facility-administered medications prior to visit.      Allergies:   Patient has no known allergies.   Social History   Social History  . Marital status: Married    Spouse name: N/A  . Number of children: N/A  . Years of education: N/A   Social History Main Topics  . Smoking status: Former Games developer  . Smokeless tobacco: Never Used  . Alcohol use No  . Drug use: No  . Sexual activity: Not on file   Other Topics Concern  . Not on file   Social History Narrative  . No narrative on file     Family History:  The patient's family history includes CAD in his father.   Review of Systems:   Please see the history of present illness.     General:  No chills, fever, night sweats or weight changes.  Cardiovascular:  No chest pain, dyspnea on exertion, edema, orthopnea, palpitations, paroxysmal nocturnal dyspnea. Dermatological: No rash, lesions/masses Respiratory: No cough, dyspnea Urologic: No hematuria, dysuria Abdominal:   No nausea, diarrhea, bright red blood per rectum, melena, or hematemesis. Positive for vomiting.  Neurologic:  No visual changes, wkns, changes  in mental status. Positive for dizziness.  All other systems reviewed and are otherwise negative except as noted above.   Physical Exam:    VS:  There were no vitals taken for this visit.   General: Well developed, well nourished elderly Caucasian male appearing in no acute distress. Head: Normocephalic, atraumatic, sclera non-icteric, no xanthomas, nares are without discharge.  Neck: No carotid bruits. JVD not elevated.  Lungs: Respirations regular and unlabored, without wheezes or rales.  Heart: Regular rate and rhythm. No S3 or S4.  No murmur, no rubs, or gallops appreciated. Crisp valve sounds appreciated.  Abdomen: Soft, non-tender, non-distended with normoactive bowel sounds. No  hepatomegaly. No rebound/guarding. No obvious abdominal masses. Msk:  Strength and tone appear normal for age. No joint deformities or effusions. Extremities: No clubbing or cyanosis. No edema.  Distal pedal pulses are 2+ bilaterally. Neuro: Alert and oriented X 3. Moves all extremities spontaneously. No focal deficits noted. Psych:  Responds to questions appropriately with a normal affect. Skin: No rashes or lesions noted  Wt Readings from Last 3 Encounters:  10/24/16 142 lb 6.4 oz (64.6 kg)  02/02/16 138 lb 14.4 oz (63 kg)  01/15/15 150 lb 11.2 oz (68.4 kg)    Studies/Labs Reviewed:   EKG:  EKG is ordered today.  The ekg ordered today demonstrates sinus bradycardia, HR 55 with 1st degree AV Block. Known LBBB.   Recent Labs: 10/24/2016: ALT 14; BUN 39; Creat 1.45; Potassium 4.8; Sodium 140   Lipid Panel    Component Value Date/Time   CHOL 209 (H) 10/24/2016 0941   TRIG 141 10/24/2016 0941   HDL 38 (L) 10/24/2016 0941   CHOLHDL 5.5 (H) 10/24/2016 0941   VLDL 28 10/24/2016 0941   LDLCALC 143 (H) 10/24/2016 0941   LDLDIRECT 109.7 12/11/2013 1045    Additional studies/ records that were reviewed today include:   Echocardiogram: 02/2016 Study Conclusions  - Left ventricle: The cavity size was severely dilated. Systolic   function was severely reduced. The estimated ejection fraction   was in the range of 20% to 25%. Severe diffuse hypokinesis.   Features are consistent with a pseudonormal left ventricular   filling pattern, with concomitant abnormal relaxation and   increased filling pressure (grade 2 diastolic dysfunction).   Doppler parameters are consistent with high ventricular filling   pressure. - Ventricular septum: Septal motion showed moderate paradox. These   changes are consistent with intraventricular conduction delay. - Aortic valve: A mechanical prosthesis was present and functioning   normally. There was trivial regurgitation. - Aorta: Ascending aortic  diameter: 51 mm (S). - Ascending aorta: The ascending aorta was moderately dilated. - Mitral valve: Calcified annulus. Mild diffuse thickening and   calcification. There was mild regurgitation. - Left atrium: The atrium was severely dilated. - Pulmonic valve: There was trivial regurgitation. - Pulmonary arteries: PA peak pressure: 37 mm Hg (S).  Impressions:  - The right ventricular systolic pressure was increased consistent   with mild pulmonary hypertension.  Assessment:    No diagnosis found.   Plan:   In order of problems listed above:  1. Dizziness - reports having one episode of dizziness occurring after a vomiting spell this past Sunday (10/22/2016). His granddaughter had recently visited and experienced similar symptoms, being diagnosed with viral gastroenteritis. His symptoms resolved within 30 minutes. He denies any repeat episodes since. No associated chest discomfort, palpitations, dyspnea, or presyncope. - No recent episodes of hypoglycemia, hypotension, or significant bradycardia. - Overall, his symptoms  of dizziness were most likely secondary to mild hypovolemia with his rece nt episode of emesis. Would not pursue further testing at this time with his resolved symptoms.   2. Severe Aortic Stenosis - s/p redo AVR with mechanical prosthesis in 1996 - remains on Coumadin. Denies any evidence of active bleeding.   3. Chronic Combined Systolic and Diastolic CHF - EF 20-25% by echo in 02/2016, with similar readings by echo in 2012 (EF 25-30% at that time).  - does not appear volume overloaded by physical exam - continue Losartan and Spironolactone. Not on BB therapy secondary to baseline bradycardia.    4. Essential HTN - BP well-controlled at 122/64 during today's visit. Reports similar readings at home. - continue Losartan 100mg  daily and Spironolactone 25mg  daily.   5. HLD - stopped Simvastatin 20mg  daily secondary to myalgias. Now on an "OTC medication".  -  recheck FLP and LFT's today.   6. LBBB - chronic. No acute changes on current EKG when compared to prior tracings.    Medication Adjustments/Labs and Tests Ordered: Current medicines are reviewed at length with the patient today.  Concerns regarding medicines are outlined above.  Medication changes, Labs and Tests ordered today are listed in the Patient Instructions below. Patient Instructions  Medication Instructions:  Your physician recommends that you continue on your current medications as directed. Please refer to the Current Medication list given to you today.  Labwork: TODAY:  LIPID PANEL & CMET  Testing/Procedures: None ordered  Follow-Up: Your physician wants you to follow-up in:  SEE DR. [place] AS SCHEDULED  Any Other Special Instructions Will Be Listed Below (If Applicable).  If you need a refill on your cardiac medications before your next appointment, please call your pharmacy.    Signed, Eleasha Cataldo [place], MD  01/13/2017 3:16 PM    [facility] [address] [place], [address] [place], [place]  [address] Phone: [phone]; Fax: [phone]  [address], [address] [place], [place] [address] Phone: [phone]

## 2017-01-16 ENCOUNTER — Ambulatory Visit: Payer: Medicare Other | Admitting: Cardiology

## 2017-01-21 NOTE — Progress Notes (Deleted)
Christopher Mann Date of Birth: [birth year]   History of Present Illness: Christopher Mann is seen for follow up CHF and AVR. He has a known history of valvular heart disease. He is status post redo aortic valve replacement in 1996 with a mechanical prosthesis at [facility]. He also has a history of coronary disease with a remote coronary stent. He has a history of dilated cardiomyopathy with ejection fraction of 25-30% by echocardiogram in September of 2012. He is on chronic Coumadin therapy. He was last seen in December with dizziness felt to be related to a bout of gastroenteritis.    On followup today he reports he is doing very well from a cardiac standpoint. He is  active. He denies dyspnea, increase in edema or orthopnea. He denies any chest pain or palpitations. He has lost another 12 lbs. He does complain of a 2 week history of cough with production of white phlegm. No fever or chills. Now on doxycycline and Mucinex. Has some sinus drainage as well.   Current Outpatient Prescriptions on File Prior to Visit  Medication Sig Dispense Refill  . losartan (COZAAR) 100 MG tablet Take 1 tablet (100 mg total) by mouth daily. 90 tablet 3  . rosuvastatin (CRESTOR) 5 MG tablet Take 1 tablet by mouth three times a week 36 tablet 3  . Saw Palmetto, Serenoa repens, (SAW PALMETTO PO) Take by mouth.      . spironolactone (ALDACTONE) 25 MG tablet Take 1 tablet (25 mg total) by mouth daily. 90 tablet 3  . tamsulosin (FLOMAX) 0.4 MG CAPS capsule Take 0.4 mg by mouth daily as needed.    . warfarin (COUMADIN) 3 MG tablet Take 3 mg by mouth daily. Take as Directed      No current facility-administered medications on file prior to visit.     No Known Allergies  Past Medical History:  Diagnosis Date  . Aortic stenosis    a. s/p redo aortic valve replacement in 1996 with mechanical prosthesis. On Coumadin  . Bundle branch block, left   . Chronic combined systolic and diastolic CHF (congestive heart failure)  (HCC)    a. 2012: Echo showing EF of 25-30% b. 02/2016: Echo with EF of 20-25%, Grade 2 DD, mechanical AV functioning normally, PA Pressure 37 mm Hg  . Coronary artery disease 05/1995   a. s/p PCI with stent placement in 1996.  . Dilated cardiomyopathy (HCC)   . Elevated fasting blood sugar   . Hypercholesterolemia   . Hypertension     Past Surgical History:  Procedure Laterality Date  . AORTIC VALVE REPLACEMENT  1996    History  Smoking Status  . Former Smoker  Smokeless Tobacco  . Never Used    History  Alcohol Use No    Family History  Problem Relation Age of Onset  . CAD Father     Review of Systems: As noted in history of present illness. All other systems were reviewed and are negative.  Physical Exam: There were no vitals taken for this visit. The patient is alert and oriented x 3.    The skin is warm and dry.  Color is normal.  The HEENT exam is normal. The carotids are 2+ without bruits.   There is no thyromegaly.  There is no JVD.  The lungs are clear.   The heart exam reveals a regular rate with a good mechanical aortic valve click.  There are no murmurs, gallops, or rubs.  The PMI is not  displaced.   Abdominal exam reveals good bowel sounds.    There is no hepatosplenomegaly or tenderness.  There are no masses.  Exam of the legs reveal no clubbing, cyanosis, or edema.   The distal pulses are intact.  Cranial nerves II - XII are intact.  Motor and sensory functions are intact.  The gait is normal.  LABORATORY DATA: Lab Results  Component Value Date   GLUCOSE 96 10/24/2016   CHOL 209 (H) 10/24/2016   TRIG 141 10/24/2016   HDL 38 (L) 10/24/2016   LDLDIRECT 109.7 12/11/2013   LDLCALC 143 (H) 10/24/2016   ALT 14 10/24/2016   AST 27 10/24/2016   NA 140 10/24/2016   K 4.8 10/24/2016   CL 108 10/24/2016   CREATININE 1.45 (H) 10/24/2016   BUN 39 (H) 10/24/2016   CO2 26 10/24/2016   HGBA1C 6.4 (H) 01/15/2015    Ecg today shows NSR with first degree AV  block. Rate 62,  LBBB. No acute change. I have personally reviewed and interpreted this study.  Labs reviewed from primary care. Findings:  INR on 01/27/16- 2.5 12/27/15: cholesterol 170, triglycerides 310, HDL 31, LDL 62. 08/03/15: CBC normal. BUN 31, creatinine 1.36. Potassium 4.3. Other chemistries OK. TSH. 4.68.  Echo 02/17/16:Study Conclusions  - Left ventricle: The cavity size was severely dilated. Systolic   function was severely reduced. The estimated ejection fraction   was in the range of 20% to 25%. Severe diffuse hypokinesis.   Features are consistent with a pseudonormal left ventricular   filling pattern, with concomitant abnormal relaxation and   increased filling pressure (grade 2 diastolic dysfunction).   Doppler parameters are consistent with high ventricular filling   pressure. - Ventricular septum: Septal motion showed moderate paradox. These   changes are consistent with intraventricular conduction delay. - Aortic valve: A mechanical prosthesis was present and functioning   normally. There was trivial regurgitation. - Aorta: Ascending aortic diameter: 51 mm (S). - Ascending aorta: The ascending aorta was moderately dilated. - Mitral valve: Calcified annulus. Mild diffuse thickening and   calcification. There was mild regurgitation. - Left atrium: The atrium was severely dilated. - Pulmonic valve: There was trivial regurgitation. - Pulmonary arteries: PA peak pressure: 37 mm Hg (S).  Impressions:  - The right ventricular systolic pressure was increased consistent   with mild pulmonary hypertension.  Assessment / Plan: 1. Chronic systolic CHF with dilated cardiomyopathy. Ejection fraction of 25-30%. Patient is asymptomatic. Continue treatment with ARB and Aldactone.   2. Status post redo aortic valve replacement with mechanical prosthesis. Functioning well by echocardiogram in September 2012. Exam is normal.   3. Hypertension, controlled.  4. Coronary disease  with remote coronary stent. Asymptomatic.  5. Hyperlipidemia. Now back on statin. LDL OK. Triglycerides should improve with weight loss.  6. DM last A1c 6.2%. Glucose improved with diet.     Follow up in 6 months.  Follow up in 6 months.

## 2017-01-22 ENCOUNTER — Ambulatory Visit: Payer: Medicare Other | Admitting: Cardiology

## 2017-03-13 NOTE — Progress Notes (Signed)
Cardiology Office Note    Date:  03/14/2017   ID:  Christopher Mann, DOB 06/10/1930, MRN [id number]  PCP:  Vivien Presto, MD  Cardiologist: Dr. Swaziland  Chief Complaint  Patient presents with  . Follow-up    6 months    History of Present Illness:    Christopher Mann is a 83 y.o. male  with past medical history of severe AS (s/p redo AVR with mechanical prosthesis in 1996, on Coumadin), CAD (s/p PCI in 1996), chronic systolic CHF (EF 78-29% by echo in 02/2016), chronic LBBB, HTN, and HLD who presents to the office today for follow-up.   He was last seen by myself in 10/2016 and was recovering from what was thought to be most consistent with a viral illness at that time. He denied any recent chest pain or dyspnea on exertion.  In talking with the patient today, he reports doing well since his last office visit. He denies any recent symptoms of chest discomfort, dyspnea on exertion, palpitations, lightheadedness, dizziness, or presyncope. No orthopnea, PND, or lower extremity edema. Weights have been stable on his home scales.  He is very active at baseline for his age, currently updating an old Zenaida Niece. Able to perform ADL's independently.  He does report 2 episodes of where he could not clearly speak his words. This lasted for up to a minute and then spontaneously improved. He denies any other neurological symptoms at that time. The episodes occurred several months ago but he just recently informed his wife of this who conveys the information concerning these episodes.     Past Medical History:  Diagnosis Date  . Aortic stenosis    a. s/p redo aortic valve replacement in 1996 with mechanical prosthesis. On Coumadin  . Bundle branch block, left   . Chronic combined systolic and diastolic CHF (congestive heart failure) (HCC)    a. 2012: Echo showing EF of 25-30% b. 02/2016: Echo with EF of 20-25%, Grade 2 DD, mechanical AV functioning normally, PA Pressure 37 mm Hg  . Coronary artery  disease 05/1995   a. s/p PCI with stent placement in 1996.  . Dilated cardiomyopathy (HCC)   . Elevated fasting blood sugar   . Hypercholesterolemia   . Hypertension     Past Surgical History:  Procedure Laterality Date  . AORTIC VALVE REPLACEMENT  1996    Current Medications: Outpatient Medications Prior to Visit  Medication Sig Dispense Refill  . losartan (COZAAR) 100 MG tablet Take 1 tablet (100 mg total) by mouth daily. 90 tablet 3  . rosuvastatin (CRESTOR) 5 MG tablet Take 1 tablet by mouth three times a week 36 tablet 3  . Saw Palmetto, Serenoa repens, (SAW PALMETTO PO) Take by mouth.      . spironolactone (ALDACTONE) 25 MG tablet Take 1 tablet (25 mg total) by mouth daily. 90 tablet 3  . tamsulosin (FLOMAX) 0.4 MG CAPS capsule Take 0.4 mg by mouth daily as needed.    . warfarin (COUMADIN) 3 MG tablet Take 3 mg by mouth daily. Take as Directed      No facility-administered medications prior to visit.      Allergies:   Patient has no known allergies.   Social History   Social History  . Marital status: Married    Spouse name: N/A  . Number of children: N/A  . Years of education: N/A   Social History Main Topics  . Smoking status: Former Games developer  . Smokeless tobacco: Never Used  . Alcohol  use No  . Drug use: No  . Sexual activity: Not Asked   Other Topics Concern  . None   Social History Narrative  . None     Family History:  The patient's family history includes CAD in his father.   Review of Systems:   Please see the history of present illness.     General:  No chills, fever, night sweats or weight changes.  Cardiovascular:  No chest pain, dyspnea on exertion, edema, orthopnea, palpitations, paroxysmal nocturnal dyspnea. Dermatological: No rash, lesions/masses Respiratory: No cough, dyspnea Urologic: No hematuria, dysuria Abdominal:   No nausea, vomiting, diarrhea, bright red blood per rectum, melena, or hematemesis Neurologic:  No visual changes,  wkns, changes in mental status. Positive for slurred speech.  All other systems reviewed and are otherwise negative except as noted above.   Physical Exam:    VS:  BP 128/66   Pulse 64   Ht 5\' 7"  (1.702 m)   Wt 138 lb (62.6 kg)   BMI 21.61 kg/m    General: Well developed, well nourished Caucasian male appearing in no acute distress. Head: Normocephalic, atraumatic, sclera non-icteric, no xanthomas, nares are without discharge.  Neck: No carotid bruits. JVD not elevated.  Lungs: Respirations regular and unlabored, without wheezes or rales.  Heart: Regular rate and rhythm. No S3 or S4.  No rubs or gallops appreciated. 2/6 SEM along RUSB.  Abdomen: Soft, non-tender, non-distended with normoactive bowel sounds. No hepatomegaly. No rebound/guarding. No obvious abdominal masses. Msk:  Strength and tone appear normal for age. No joint deformities or effusions. Extremities: No clubbing or cyanosis. No lower extremity edema.  Distal pedal pulses are 2+ bilaterally. Neuro: Alert and oriented X 3. Moves all extremities spontaneously. No focal deficits noted. Psych:  Responds to questions appropriately with a normal affect. Skin: No rashes or lesions noted  Wt Readings from Last 3 Encounters:  03/14/17 138 lb (62.6 kg)  10/24/16 142 lb 6.4 oz (64.6 kg)  02/02/16 138 lb 14.4 oz (63 kg)     Studies/Labs Reviewed:   EKG:  EKG is not ordered today.   Recent Labs: 10/24/2016: ALT 14; BUN 39; Creat 1.45; Potassium 4.8; Sodium 140   Lipid Panel    Component Value Date/Time   CHOL 209 (H) 10/24/2016 0941   TRIG 141 10/24/2016 0941   HDL 38 (L) 10/24/2016 0941   CHOLHDL 5.5 (H) 10/24/2016 0941   VLDL 28 10/24/2016 0941   LDLCALC 143 (H) 10/24/2016 0941   LDLDIRECT 109.7 12/11/2013 1045    Additional studies/ records that were reviewed today include:   Echocardiogram: 02/2016  Study Conclusions  - Left ventricle: The cavity size was severely dilated. Systolic   function was  severely reduced. The estimated ejection fraction   was in the range of 20% to 25%. Severe diffuse hypokinesis.   Features are consistent with a pseudonormal left ventricular   filling pattern, with concomitant abnormal relaxation and   increased filling pressure (grade 2 diastolic dysfunction).   Doppler parameters are consistent with high ventricular filling   pressure. - Ventricular septum: Septal motion showed moderate paradox. These   changes are consistent with intraventricular conduction delay. - Aortic valve: A mechanical prosthesis was present and functioning   normally. There was trivial regurgitation. - Aorta: Ascending aortic diameter: 51 mm (S). - Ascending aorta: The ascending aorta was moderately dilated. - Mitral valve: Calcified annulus. Mild diffuse thickening and   calcification. There was mild regurgitation. - Left atrium: The atrium  was severely dilated. - Pulmonic valve: There was trivial regurgitation. - Pulmonary arteries: PA peak pressure: 37 mm Hg (S).  Impressions:  - The right ventricular systolic pressure was increased consistent   with mild pulmonary hypertension.  Assessment:    1. Chronic combined systolic and diastolic CHF (congestive heart failure) (HCC)   2. Severe aortic valve stenosis   3. Coronary artery disease involving native coronary artery of native heart without angina pectoris   4. Essential hypertension   5. Hypercholesterolemia   6. Slurred speech      Plan:   In order of problems listed above:  1. Chronic Combined Systolic and Diastolic CHF - EF 20-25% by echo in 02/2016. - He denies any recent orthopnea, PND, or lower extremity edema. He does not appear volume overloaded by physical examination today. - continue Cozaar 100mg  daily and Spironolactone 25mg  daily. Not on BB therapy secondary to history of bradycardia. Reports episodes of BP in the low-100's at home, therefore not an ideal candidate for Entresto.   2. Severe  Aortic Stenosis - s/p redo AVR with mechanical prosthesis in 1996. Functioning normally by echo in 02/2016. - continue Coumadin. INR followed by PCP.  3. CAD - s/p PCI in 1996 - he denies any recent anginal symptoms.  - continue statin therapy. No BB secondary to bradycardia. No ASA secondary to need for Coumadin.  4. HTN - BP well-controlled at 128/66 during today's visit. - continue current medication regimen.  5. HLD - Lipid Panel in 10/2016 showed total cholesterol 209, HDL 38, and LDL 143. He had self-discontinued his statin at that time secondary to myalgias but was willing to try Crestor 5mg  three times weekly. Reports good compliance with this.  - He is not fasting today. Will obtain FLP and LFTs next week at his PCP's office per patient's request.   6. Slurred Speech - Reports 2 episodes of this over the past 6 months with no other focal neurological deficits noted. Symptoms resolved within 1 minute. - If this occurs again, the patient was encouraged to follow-up with Neurology as his symptoms sound concerning for a TIA.   Medication Adjustments/Labs and Tests Ordered: Current medicines are reviewed at length with the patient today.  Concerns regarding medicines are outlined above.  Medication changes, Labs and Tests ordered today are listed in the Patient Instructions below. Patient Instructions  Medication Instructions:  Your physician recommends that you continue on your current medications as directed. Please refer to the Current Medication list given to you today.  If you need a refill on your cardiac medications before your next appointment, please call your pharmacy.  Labwork: MAKE SURE TO HAVE FASTING LIPID PANEL AND CMP AT YOUR PCP'S OFFICE  Follow-Up: Your physician wants you to follow-up in: 6 MONTHS WITH DR [place] You will receive a reminder letter in the mail two months in advance. If you don't receive a letter, please call our office September 2018 to schedule  the November 2018 follow-up appointment.   Signed, Ellsworth Lennox, PA-C  03/14/2017 4:08 PM    [facility] [address] [place], [address] [place], [place]  [address] Phone: [phone]; Fax: [phone]  [address] [place], [place] [address] Phone: [phone]

## 2017-03-14 ENCOUNTER — Ambulatory Visit (INDEPENDENT_AMBULATORY_CARE_PROVIDER_SITE_OTHER): Payer: Medicare Other | Admitting: Student

## 2017-03-14 ENCOUNTER — Encounter: Payer: Self-pay | Admitting: Student

## 2017-03-14 VITALS — BP 128/66 | HR 64 | Ht 67.0 in | Wt 138.0 lb

## 2017-03-14 DIAGNOSIS — I10 Essential (primary) hypertension: Secondary | ICD-10-CM | POA: Diagnosis not present

## 2017-03-14 DIAGNOSIS — I5042 Chronic combined systolic (congestive) and diastolic (congestive) heart failure: Secondary | ICD-10-CM | POA: Diagnosis not present

## 2017-03-14 DIAGNOSIS — I350 Nonrheumatic aortic (valve) stenosis: Secondary | ICD-10-CM | POA: Diagnosis not present

## 2017-03-14 DIAGNOSIS — E7800 Pure hypercholesterolemia, unspecified: Secondary | ICD-10-CM | POA: Diagnosis not present

## 2017-03-14 DIAGNOSIS — I2510 Atherosclerotic heart disease of native coronary artery without angina pectoris: Secondary | ICD-10-CM | POA: Diagnosis not present

## 2017-03-14 DIAGNOSIS — R4781 Slurred speech: Secondary | ICD-10-CM

## 2017-03-14 NOTE — Patient Instructions (Signed)
Medication Instructions:  Your physician recommends that you continue on your current medications as directed. Please refer to the Current Medication list given to you today.  If you need a refill on your cardiac medications before your next appointment, please call your pharmacy.  Labwork: MAKE SURE TO HAVE FASTING LIPID PANEL AND CMP AT YOUR PCP'S OFFICE  Follow-Up: Your physician wants you to follow-up in: 6 MONTHS WITH DR Sw[place]iland You will receive a reminder letter in the mail two months in advance. If you don't receive a letter, please call our office September 2018 to schedule the November 2018 follow-up appointment.  Thank you for choosing [facility] at [facility]!!

## 2017-06-27 ENCOUNTER — Other Ambulatory Visit: Payer: Self-pay | Admitting: Student

## 2017-08-14 ENCOUNTER — Other Ambulatory Visit: Payer: Self-pay | Admitting: Student

## 2017-09-01 NOTE — Progress Notes (Deleted)
Cardiology Office Note    Date:  09/01/2017   ID:  Christopher LeedsWilliam Lanyon, DOB July 19, 1930, MRN [id number]  PCP:  Vivien Prestoorrington, Kip A, MD  Cardiologist: Dr. SwazilandJordan  No chief complaint on file.   History of Present Illness:    Christopher Mann is a 83 y.o. male  with past medical history of severe AS (s/p redo AVR with mechanical prosthesis in 1996, on Coumadin), CAD (s/p PCI in 1996), chronic systolic CHF (EF 40-98%20-25% by echo in 02/2016), chronic LBBB, HTN, and HLD who presents to the office today for follow-up.   In talking with the patient today, he reports doing well since his last office visit. He denies any recent symptoms of chest discomfort, dyspnea on exertion, palpitations, lightheadedness, dizziness, or presyncope. No orthopnea, PND, or lower extremity edema. Weights have been stable on his home scales.  He is very active at baseline for his age, currently updating an old Zenaida Niecevan. Able to perform ADL's independently.  He does report 2 episodes of where he could not clearly speak his words. This lasted for up to a minute and then spontaneously improved. He denies any other neurological symptoms at that time. The episodes occurred several months ago but he just recently informed his wife of this who conveys the information concerning these episodes.     Past Medical History:  Diagnosis Date  . Aortic stenosis    a. s/p redo aortic valve replacement in 1996 with mechanical prosthesis. On Coumadin  . Bundle branch block, left   . Chronic combined systolic and diastolic CHF (congestive heart failure) (HCC)    a. 2012: Echo showing EF of 25-30% b. 02/2016: Echo with EF of 20-25%, Grade 2 DD, mechanical AV functioning normally, PA Pressure 37 mm Hg  . Coronary artery disease 05/1995   a. s/p PCI with stent placement in 1996.  . Dilated cardiomyopathy (HCC)   . Elevated fasting blood sugar   . Hypercholesterolemia   . Hypertension     Past Surgical History:  Procedure Laterality Date  .  AORTIC VALVE REPLACEMENT  1996    Current Medications: Outpatient Medications Prior to Visit  Medication Sig Dispense Refill  . losartan (COZAAR) 100 MG tablet Take 1 tablet (100 mg total) by mouth daily. 90 tablet 3  . rosuvastatin (CRESTOR) 5 MG tablet TAKE 1 TABLET BY MOUTH 3 TIMES PER WEEK 36 tablet 4  . Saw Palmetto, Serenoa repens, (SAW PALMETTO PO) Take by mouth.      . spironolactone (ALDACTONE) 25 MG tablet Take 1 tablet (25 mg total) by mouth daily. 90 tablet 3  . tamsulosin (FLOMAX) 0.4 MG CAPS capsule Take 0.4 mg by mouth daily as needed.    . warfarin (COUMADIN) 3 MG tablet Take 3 mg by mouth daily. Take as Directed      No facility-administered medications prior to visit.      Allergies:   Patient has no known allergies.   Social History   Social History  . Marital status: Married    Spouse name: N/A  . Number of children: N/A  . Years of education: N/A   Social History Main Topics  . Smoking status: Former Games developermoker  . Smokeless tobacco: Never Used  . Alcohol use No  . Drug use: No  . Sexual activity: Not on file   Other Topics Concern  . Not on file   Social History Narrative  . No narrative on file     Family History:  The patient's family history  includes CAD in his father.   Review of Systems:   Please see the history of present illness.     General:  No chills, fever, night sweats or weight changes.  Cardiovascular:  No chest pain, dyspnea on exertion, edema, orthopnea, palpitations, paroxysmal nocturnal dyspnea. Dermatological: No rash, lesions/masses Respiratory: No cough, dyspnea Urologic: No hematuria, dysuria Abdominal:   No nausea, vomiting, diarrhea, bright red blood per rectum, melena, or hematemesis Neurologic:  No visual changes, wkns, changes in mental status. Positive for slurred speech.  All other systems reviewed and are otherwise negative except as noted above.   Physical Exam:    VS:  There were no vitals taken for this visit.    General: Well developed, well nourished Caucasian male appearing in no acute distress. Head: Normocephalic, atraumatic, sclera non-icteric, no xanthomas, nares are without discharge.  Neck: No carotid bruits. JVD not elevated.  Lungs: Respirations regular and unlabored, without wheezes or rales.  Heart: Regular rate and rhythm. No S3 or S4.  No rubs or gallops appreciated. 2/6 SEM along RUSB.  Abdomen: Soft, non-tender, non-distended with normoactive bowel sounds. No hepatomegaly. No rebound/guarding. No obvious abdominal masses. Msk:  Strength and tone appear normal for age. No joint deformities or effusions. Extremities: No clubbing or cyanosis. No lower extremity edema.  Distal pedal pulses are 2+ bilaterally. Neuro: Alert and oriented X 3. Moves all extremities spontaneously. No focal deficits noted. Psych:  Responds to questions appropriately with a normal affect. Skin: No rashes or lesions noted  Wt Readings from Last 3 Encounters:  03/14/17 138 lb (62.6 kg)  10/24/16 142 lb 6.4 oz (64.6 kg)  02/02/16 138 lb 14.4 oz (63 kg)     Studies/Labs Reviewed:   EKG:  EKG is not ordered today.   Recent Labs: 10/24/2016: ALT 14; BUN 39; Creat 1.45; Potassium 4.8; Sodium 140   Lipid Panel    Component Value Date/Time   CHOL 209 (H) 10/24/2016 0941   TRIG 141 10/24/2016 0941   HDL 38 (L) 10/24/2016 0941   CHOLHDL 5.5 (H) 10/24/2016 0941   VLDL 28 10/24/2016 0941   LDLCALC 143 (H) 10/24/2016 0941   LDLDIRECT 109.7 12/11/2013 1045   Labs dated 05/09/17: BUN 40, creatinine 1.43. Otherwise CMET normal. Cholesterol 167, triglycerides 96, HDL 38, LDL 110.  Additional studies/ records that were reviewed today include:   Echocardiogram: 02/2016  Study Conclusions  - Left ventricle: The cavity size was severely dilated. Systolic   function was severely reduced. The estimated ejection fraction   was in the range of 20% to 25%. Severe diffuse hypokinesis.   Features are consistent with  a pseudonormal left ventricular   filling pattern, with concomitant abnormal relaxation and   increased filling pressure (grade 2 diastolic dysfunction).   Doppler parameters are consistent with high ventricular filling   pressure. - Ventricular septum: Septal motion showed moderate paradox. These   changes are consistent with intraventricular conduction delay. - Aortic valve: A mechanical prosthesis was present and functioning   normally. There was trivial regurgitation. - Aorta: Ascending aortic diameter: 51 mm (S). - Ascending aorta: The ascending aorta was moderately dilated. - Mitral valve: Calcified annulus. Mild diffuse thickening and   calcification. There was mild regurgitation. - Left atrium: The atrium was severely dilated. - Pulmonic valve: There was trivial regurgitation. - Pulmonary arteries: PA peak pressure: 37 mm Hg (S).  Impressions:  - The right ventricular systolic pressure was increased consistent   with mild pulmonary hypertension.  Assessment:  No diagnosis found.   Plan:   In order of problems listed above:  1. Chronic Combined Systolic and Diastolic CHF - EF 20-25% by echo in 02/2016. - He denies any recent orthopnea, PND, or lower extremity edema. He does not appear volume overloaded by physical examination today. - continue Cozaar 100mg  daily and Spironolactone 25mg  daily. Not on BB therapy secondary to history of bradycardia. Reports episodes of BP in the low-100's at home, therefore not an ideal candidate for Entresto.   2. Severe Aortic Stenosis - s/p redo AVR with mechanical prosthesis in 1996. Functioning normally by echo in 02/2016. - continue Coumadin. INR followed by PCP.  3. CAD - s/p PCI in 1996 - he denies any recent anginal symptoms.  - continue statin therapy. No BB secondary to bradycardia. No ASA secondary to need for Coumadin.  4. HTN - BP well-controlled at 128/66 during today's visit. - continue current medication  regimen.  5. HLD - Lipid Panel in 10/2016 showed total cholesterol 209, HDL 38, and LDL 143. He had self-discontinued his statin at that time secondary to myalgias but was willing to try Crestor 5mg  three times weekly. Reports good compliance with this.   6. Slurred Speech - Reports 2 episodes of this over the past 6 months with no other focal neurological deficits noted. Symptoms resolved within 1 minute. - If this occurs again, the patient was encouraged to follow-up with Neurology as his symptoms sound concerning for a TIA.   Medication Adjustments/Labs and Tests Ordered: Current medicines are reviewed at length with the patient today.  Concerns regarding medicines are outlined above.  Medication changes, Labs and Tests ordered today are listed in the Patient Instructions below. Patient Instructions  Medication Instructions:  Your physician recommends that you continue on your current medications as directed. Please refer to the Current Medication list given to you today.  If you need a refill on your cardiac medications before your next appointment, please call your pharmacy.  Labwork: MAKE SURE TO HAVE FASTING LIPID PANEL AND CMP AT YOUR PCP'S OFFICE  Follow-Up: Your physician wants you to follow-up in: 6 MONTHS WITH DR Swaziland You will receive a reminder letter in the mail two months in advance. If you don't receive a letter, please call our office September 2018 to schedule the November 2018 follow-up appointment.   Signed, Peter Swaziland, MD  09/01/2017 10:06 AM    [facility] [address], [address] [place], [place]  [address] Phone: [phone]; Fax: [phone]  [address] [place], [place] [address] Phone: [phone]

## 2017-09-06 ENCOUNTER — Encounter: Payer: Self-pay | Admitting: Cardiology

## 2017-09-06 ENCOUNTER — Ambulatory Visit (INDEPENDENT_AMBULATORY_CARE_PROVIDER_SITE_OTHER): Payer: Medicare Other | Admitting: Cardiology

## 2017-09-06 ENCOUNTER — Emergency Department (HOSPITAL_COMMUNITY)
Admission: EM | Admit: 2017-09-06 | Discharge: 2017-09-06 | Disposition: A | Discharge status: Home / Self Care | Payer: Medicare Other | Attending: Emergency Medicine | Admitting: Emergency Medicine

## 2017-09-06 ENCOUNTER — Other Ambulatory Visit: Payer: Self-pay

## 2017-09-06 ENCOUNTER — Ambulatory Visit: Payer: Medicare Other | Admitting: Cardiology

## 2017-09-06 ENCOUNTER — Emergency Department (HOSPITAL_COMMUNITY): Payer: Medicare Other

## 2017-09-06 ENCOUNTER — Encounter (HOSPITAL_COMMUNITY): Payer: Self-pay | Admitting: *Deleted

## 2017-09-06 VITALS — BP 110/70 | HR 157 | Ht 67.0 in | Wt 147.0 lb

## 2017-09-06 DIAGNOSIS — I5042 Chronic combined systolic (congestive) and diastolic (congestive) heart failure: Secondary | ICD-10-CM

## 2017-09-06 DIAGNOSIS — Z952 Presence of prosthetic heart valve: Secondary | ICD-10-CM | POA: Diagnosis not present

## 2017-09-06 DIAGNOSIS — I110 Hypertensive heart disease with heart failure: Secondary | ICD-10-CM | POA: Diagnosis not present

## 2017-09-06 DIAGNOSIS — I447 Left bundle-branch block, unspecified: Secondary | ICD-10-CM

## 2017-09-06 DIAGNOSIS — I2510 Atherosclerotic heart disease of native coronary artery without angina pectoris: Secondary | ICD-10-CM | POA: Diagnosis not present

## 2017-09-06 DIAGNOSIS — I420 Dilated cardiomyopathy: Secondary | ICD-10-CM | POA: Diagnosis not present

## 2017-09-06 DIAGNOSIS — I483 Typical atrial flutter: Secondary | ICD-10-CM | POA: Diagnosis not present

## 2017-09-06 DIAGNOSIS — Z87891 Personal history of nicotine dependence: Secondary | ICD-10-CM | POA: Diagnosis not present

## 2017-09-06 DIAGNOSIS — R002 Palpitations: Secondary | ICD-10-CM | POA: Diagnosis present

## 2017-09-06 DIAGNOSIS — E7800 Pure hypercholesterolemia, unspecified: Secondary | ICD-10-CM

## 2017-09-06 DIAGNOSIS — Z7901 Long term (current) use of anticoagulants: Secondary | ICD-10-CM | POA: Diagnosis not present

## 2017-09-06 DIAGNOSIS — I4892 Unspecified atrial flutter: Secondary | ICD-10-CM | POA: Diagnosis not present

## 2017-09-06 LAB — CBC WITH DIFFERENTIAL/PLATELET
BASOS PCT: 0 %
Basophils Absolute: 0 10*3/uL (ref 0.0–0.1)
Eosinophils Absolute: 0 10*3/uL (ref 0.0–0.7)
Eosinophils Relative: 1 %
HEMATOCRIT: 39.8 % (ref 39.0–52.0)
HEMOGLOBIN: 12.8 g/dL — AB (ref 13.0–17.0)
LYMPHS ABS: 1 10*3/uL (ref 0.7–4.0)
LYMPHS PCT: 13 %
MCH: 30 pg (ref 26.0–34.0)
MCHC: 32.2 g/dL (ref 30.0–36.0)
MCV: 93.4 fL (ref 78.0–100.0)
MONOS PCT: 8 %
Monocytes Absolute: 0.6 10*3/uL (ref 0.1–1.0)
NEUTROS ABS: 6.2 10*3/uL (ref 1.7–7.7)
NEUTROS PCT: 78 %
Platelets: 195 10*3/uL (ref 150–400)
RBC: 4.26 MIL/uL (ref 4.22–5.81)
RDW: 14.2 % (ref 11.5–15.5)
WBC: 7.9 10*3/uL (ref 4.0–10.5)

## 2017-09-06 LAB — COMPREHENSIVE METABOLIC PANEL
ALBUMIN: 3.7 g/dL (ref 3.5–5.0)
ALK PHOS: 54 U/L (ref 38–126)
ALT: 59 U/L (ref 17–63)
AST: 60 U/L — ABNORMAL HIGH (ref 15–41)
Anion gap: 8 (ref 5–15)
BILIRUBIN TOTAL: 1.2 mg/dL (ref 0.3–1.2)
BUN: 52 mg/dL — AB (ref 6–20)
CALCIUM: 10.1 mg/dL (ref 8.9–10.3)
CO2: 18 mmol/L — ABNORMAL LOW (ref 22–32)
Chloride: 110 mmol/L (ref 101–111)
Creatinine, Ser: 1.98 mg/dL — ABNORMAL HIGH (ref 0.61–1.24)
GFR calc Af Amer: 34 mL/min — ABNORMAL LOW (ref >60)
GFR calc non Af Amer: 29 mL/min — ABNORMAL LOW (ref >60)
GLUCOSE: 126 mg/dL — AB (ref 65–99)
Potassium: 4.9 mmol/L (ref 3.5–5.1)
Sodium: 136 mmol/L (ref 135–145)
TOTAL PROTEIN: 6.3 g/dL — AB (ref 6.5–8.1)

## 2017-09-06 LAB — I-STAT CHEM 8, ED
BUN: 46 mg/dL — AB (ref 6–20)
CALCIUM ION: 1.35 mmol/L (ref 1.15–1.40)
CREATININE: 2 mg/dL — AB (ref 0.61–1.24)
Chloride: 111 mmol/L (ref 101–111)
GLUCOSE: 126 mg/dL — AB (ref 65–99)
HCT: 41 % (ref 39.0–52.0)
HEMOGLOBIN: 13.9 g/dL (ref 13.0–17.0)
Potassium: 4.8 mmol/L (ref 3.5–5.1)
Sodium: 140 mmol/L (ref 135–145)
TCO2: 19 mmol/L — AB (ref 22–32)

## 2017-09-06 LAB — PROTIME-INR
INR: 2.73
PROTHROMBIN TIME: 28.7 s — AB (ref 11.4–15.2)

## 2017-09-06 MED ORDER — ADENOSINE 6 MG/2ML IV SOLN
6.0000 mg | Freq: Once | INTRAVENOUS | Status: AC
  Administered 2017-09-06: 6 mg via INTRAVENOUS
  Filled 2017-09-06: qty 2

## 2017-09-06 MED ORDER — ADENOSINE 6 MG/2ML IV SOLN
INTRAVENOUS | Status: AC
  Administered 2017-09-06: 12 mg
  Filled 2017-09-06: qty 4

## 2017-09-06 MED ORDER — PROPOFOL 10 MG/ML IV BOLUS
0.5000 mg/kg | Freq: Once | INTRAVENOUS | Status: AC
  Administered 2017-09-06: 33.4 mg via INTRAVENOUS
  Filled 2017-09-06: qty 20

## 2017-09-06 NOTE — Consult Note (Signed)
Cardiology Consultation:   Patient ID: Christopher Mann; [id number]; 11-May-1930   Admit date: 09/06/2017 Date of Consult: 09/06/2017  Primary Care Provider: Vivien Presto, MD Primary Cardiologist: [place] Primary Electrophysiologist:  N/A   Patient Profile:   Christopher Mann is a 83 y.o. male with a hx of well compensated chronic systolic and diastolic heart failure, redo aortic valve replacement with mechanical prosthesis on chronic warfarin, chronic left bundle branch block, coronary artery disease without need for revascularization in the last 20 years who is being seen today for the evaluation of persistent atrial flutter with rapid ventricular response at the request of  Dr. Madilyn Hook.  History of Present Illness:   Christopher Mann presented to the office today for routine follow-up and had no complaints other than the perception that his heart rate had been a little fast over the last few days. He was found to be in rapid supraventricular rhythm, with wide QRS complex due to previous left bundle branch block. He was referred to the emergency room. Adenosine had no benefit and he underwent electrical cardioversion to normal sinus rhythm without complications. Throughout the entire series of events he denied dizziness, syncope, dyspnea, palpitations or angina. He is chronically and thoroughly anticoagulated for his mechanical valve. He has not had recent periods with subtherapeutic anticoagulation and has not had any embolic or bleeding complications.  He has now maintained normal rhythm for couple of hours, sinus at about 65 bpm.  Past Medical History:  Diagnosis Date  . Aortic stenosis    a. s/p redo aortic valve replacement in 1996 with mechanical prosthesis. On Coumadin  . Bundle branch block, left   . Chronic combined systolic and diastolic CHF (congestive heart failure) (HCC)    a. 2012: Echo showing EF of 25-30% b. 02/2016: Echo with EF of 20-25%, Grade 2 DD, mechanical AV functioning  normally, PA Pressure 37 mm Hg  . Coronary artery disease 05/1995   a. s/p PCI with stent placement in 1996.  . Dilated cardiomyopathy (HCC)   . Elevated fasting blood sugar   . Hypercholesterolemia   . Hypertension     Past Surgical History:  Procedure Laterality Date  . AORTIC VALVE REPLACEMENT  1996     Home Medications:  Prior to Admission medications   Medication Sig Start Date End Date Taking? Authorizing Provider  losartan (COZAAR) 100 MG tablet Take 1 tablet (100 mg total) by mouth daily. 07/01/14   [place]il[place], Peter M, MD  rosuvastatin (CRESTOR) 5 MG tablet TAKE 1 TABLET BY MOUTH 3 TIMES PER WEEK 08/14/17   [place], Peter M, MD  Saw Palmetto, Serenoa repens, (SAW PALMETTO PO) Take by mouth.      [provider]  spironolactone (ALDACTONE) 25 MG tablet Take 1 tablet (25 mg total) by mouth daily. 07/01/14   [place], Peter M, MD  tamsulosin (FLOMAX) 0.4 MG CAPS capsule Take 0.4 mg by mouth daily as needed.    [provider]  warfarin (COUMADIN) 3 MG tablet Take 3 mg by mouth daily. Take as Directed     [provider]    Inpatient Medications: Scheduled Meds:  Continuous Infusions:  PRN Meds:   Allergies:   No Known Allergies  Social History:   Social History   Social History  . Marital status: Married    Spouse name: N/A  . Number of children: N/A  . Years of education: N/A   Occupational History  . Not on file.   Social History Main Topics  .  Smoking status: Former Games developermoker  . Smokeless tobacco: Never Used  . Alcohol use No  . Drug use: No  . Sexual activity: Not on file   Other Topics Concern  . Not on file   Social History Narrative  . No narrative on file    Family History:    Family History  Problem Relation Age of Onset  . CAD Father      ROS:  Please see the history of present illness.  ROS  All other ROS reviewed and negative.     Physical Exam/Data:   Vitals:   09/06/17 1400 09/06/17 1415 09/06/17 1445  09/06/17 1500  BP: (!) 125/58 108/74 132/84 (!) 129/95  Pulse: (!) 58 (!) 55 63 (!) 56  Resp: (!) 25 (!) 25 (!) 29 (!) 25  SpO2: 99% 97% 99% 99%   No intake or output data in the 24 hours ending 09/06/17 1530 There were no vitals filed for this visit. There is no height or weight on file to calculate BMI.  General:  Well nourished, well developed, in no acute distress HEENT: normal Lymph: no adenopathy Neck: no JVD Endocrine:  No thryomegaly Vascular: No carotid bruits; FA pulses 2+ bilaterally without bruits  Cardiac:  normal S1, S2; RRR; clear and crisp prosthetic valve clicks, very early peaking and mild aortic ejection murmur, no diastolic murmurs Lungs:  clear to auscultation bilaterally, no wheezing, rhonchi or rales  Abd: soft, nontender, no hepatomegaly  Ext: no edema Musculoskeletal:  No deformities, BUE and BLE strength normal and equal Skin: warm and dry  Neuro:  CNs 2-12 intact, no focal abnormalities noted Psych:  Normal affect   EKG:  The EKG was personally reviewed and demonstrates:  Uterus super ventricular tachycardia, probably atrial flutter with 2:1 AV block and wide QRS due to pre-existing left bundle branch block. After cardioversion the rhythm is normal sinus with left bundle branch block Telemetry:  Telemetry was personally reviewed and demonstrates:  Atrial flutter followed by sinus rhythm after the cardioversion  Laboratory Data:  Chemistry Recent Labs Lab 09/06/17 1103  NA 136  K 4.9  CL 110  CO2 18*  GLUCOSE 126*  BUN 52*  CREATININE 1.98*  CALCIUM 10.1  GFRNONAA 29*  GFRAA 34*  ANIONGAP 8     Recent Labs Lab 09/06/17 1103  PROT 6.3*  ALBUMIN 3.7  AST 60*  ALT 59  ALKPHOS 54  BILITOT 1.2   Hematology Recent Labs Lab 09/06/17 1103  WBC 7.9  RBC 4.26  HGB 12.8*  HCT 39.8  MCV 93.4  MCH 30.0  MCHC 32.2  RDW 14.2  PLT 195   Cardiac EnzymesNo results for input(s): TROPONINI in the last 168 hours. No results for input(s):  TROPIPOC in the last 168 hours.  BNPNo results for input(s): BNP, PROBNP in the last 168 hours.  DDimer No results for input(s): DDIMER in the last 168 hours.  Radiology/Studies:  Dg Chest 2 View  Result Date: 09/06/2017 CLINICAL DATA:  Atrial flutter. EXAM: CHEST  2 VIEW COMPARISON:  None. FINDINGS: Postsurgical changes related to prior CABG. Cardiomegaly. Prior aortic valve replacement. Normal pulmonary vascularity. Atherosclerotic calcification of the aortic arch. Bibasilar atelectasis. No focal consolidation, pleural effusion, or pneumothorax. No acute osseous abnormality. IMPRESSION: Cardiomegaly.  No active cardiopulmonary disease. Electronically Signed   By: Obie DredgeWilliam T Derry M.D.   On: 09/06/2017 14:00    Assessment and Plan:   1. Atrial flutter: This is the first documented episode of atrial arrhythmia. It's  unlikely we will achieve good ventricular rate control even if we add AV blocking medications. He has a past history of bradycardia with beta blockers. Due to low EF the only appropriate antiarrhythmic medications with the dofetilide or amiodarone, which would be associated with prolonged hospitalization and many side effects, respectively. With current renal function, dofetilide may not be a good idea anyway. Either one of these medications might precipitate worsening conduction abnormalities and need for pacemaker therapy. I don't think this would be warranted for a first episode of essentially asymptomatic atrial flutter. If he does not have recurrent atrial flutter for several years I would just do another cardioversion at that time. If he has early recurrence of arrhythmia, he may be best suited for cavotricuspid isthmus ablation, even if this will require temporary interruption of anticoagulation. Therefore, no changes are made to his medications today. 2. CHF: Excellent functional status without any clear symptoms associated with the arrhythmia. He appears to be euvolemic. No change  to his medications. Continue his angiotensin receptor blocker. Not receiving beta blockers due to bradycardia. 3. CAD: No angina despite marked tachycardia. Asymptomatic for over 20 years. On statin 3 days weekly due to side effects. May be time to recheck his fasting lipid profile. 4. S/P AVR: Normal device function by most recent echo. Appropriate anticoagulated.  Will arrange an early follow-up visit with one of our APP to make sure he is maintaining normal rhythm. Recommend rechecking a metabolic panel at his follow-up appointment due to the worsening renal parameters. Potassium level is normal. Continue spironolactone and losartan for the time being.   Family asked about a way to monitor for recurrence of arrhythmia since she is unaware of the tachycardia. Discussed various options and they seem to be interested in acquiring a Kardia device.   For questions or updates, please contact [facility] Please consult [email or web address] for contact info under Cardiology/STEMI.   Signed, Thurmon Fair, MD  09/06/2017 3:30 PM

## 2017-09-06 NOTE — ED Notes (Signed)
No change in cardiac rhythm after administration of adenosine 

## 2017-09-06 NOTE — ED Provider Notes (Signed)
[facility] Provider Note   CSN: [id number] Arrival date & time: 09/06/17  1038     History   Chief Complaint Chief Complaint  Patient presents with  . Palpitations    HPI Christopher Mann is a 83 y.o. male.  The history is provided by the patient. No language interpreter was used.  Palpitations      Christopher Mann is a 83 y.o. male who presents to the Emergency Department complaining of referred by Cardiology.  He presented to the cardiology clinic for a routine visit today.  He reports feeling well.  He was noted to have a heart rate in the 150s and was referred to to the emergency department for further evaluation.  He does report increased belching over the last couple days.  His wife thinks that he was breathing heavy last night but now back to normal.  He denies any chest pain, difficulty breathing, palpitations, abdominal pain, leg swelling or pain.  He does have a history of aortic valve replacement and has been therapeutic on his Coumadin.  No prior history of abnormal rhythms.  Past Medical History:  Diagnosis Date  . Aortic stenosis    a. s/p redo aortic valve replacement in 1996 with mechanical prosthesis. On Coumadin  . Bundle branch block, left   . Chronic combined systolic and diastolic CHF (congestive heart failure) (HCC)    a. 2012: Echo showing EF of 25-30% b. 02/2016: Echo with EF of 20-25%, Grade 2 DD, mechanical AV functioning normally, PA Pressure 37 mm Hg  . Coronary artery disease 05/1995   a. s/p PCI with stent placement in 1996.  . Dilated cardiomyopathy (HCC)   . Elevated fasting blood sugar   . Hypercholesterolemia   . Hypertension     Patient Active Problem List   Diagnosis Date Noted  . Elevated fasting blood sugar 07/01/2014  . S/P AVR 07/25/2012  . Severe aortic valve stenosis   . Bundle branch block, left   . Hypertension   . Dilated cardiomyopathy (HCC)   . Hypercholesterolemia   . Coronary artery  disease 05/14/1995    Past Surgical History:  Procedure Laterality Date  . AORTIC VALVE REPLACEMENT  1996       Home Medications    Prior to Admission medications   Medication Sig Start Date End Date Taking? Authorizing Provider  acetaminophen (TYLENOL) 325 MG tablet Take 325-650 mg by mouth every 6 (six) hours as needed (for pain or headaches).   Yes [provider]  losartan (COZAAR) 100 MG tablet Take 1 tablet (100 mg total) by mouth daily. 07/01/14  Yes [place], Peter M, MD  OVER THE COUNTER MEDICATION Heart Health Multivitamin (name?): Take 1 tablet by mouth three times a day   Yes [provider]  rosuvastatin (CRESTOR) 5 MG tablet TAKE 1 TABLET BY MOUTH 3 TIMES PER WEEK Patient taking differently: Take 5 mg by mouth once a day on Mon/Wed/Fri 08/14/17  Yes [place], Peter M, MD  Saw Palmetto, Serenoa repens, (SAW PALMETTO PO) Take 1 tablet by mouth 3 (three) times daily.    Yes [provider]  spironolactone (ALDACTONE) 25 MG tablet Take 1 tablet (25 mg total) by mouth daily. 07/01/14  Yes [place], Peter M, MD  tamsulosin (FLOMAX) 0.4 MG CAPS capsule Take 0.4 mg by mouth daily as needed (for urinary symptoms).    Yes [provider]  warfarin (COUMADIN) 3 MG tablet Take 1.5-3 mg by mouth See admin instructions. 3  mg in the morning on Sun/Mon/Wed/Thurs/Sat and 1.5 mg on Tues/Fri   Yes [provider]    Family History Family History  Problem Relation Age of Onset  . CAD Father     Social History Social History  Substance Use Topics  . Smoking status: Former Games developer  . Smokeless tobacco: Never Used  . Alcohol use No     Allergies   Bee venom   Review of Systems Review of Systems  Cardiovascular: Positive for palpitations.  All other systems reviewed and are negative.    Physical Exam Updated Vital Signs BP (!) 124/100   Pulse 63   Resp (!) 28   SpO2 98%   Physical Exam  Constitutional: He is oriented to person,  place, and time. He appears well-developed and well-nourished.  HENT:  Head: Normocephalic and atraumatic.  Cardiovascular:  Systolic murmur with mechanical click, tachycardic  Pulmonary/Chest: Effort normal. No respiratory distress.  Occasional crackle in the right lung base  Abdominal: Soft. There is no tenderness. There is no rebound and no guarding.  Musculoskeletal: He exhibits no tenderness.  Trace pitting edema to BLE  Neurological: He is alert and oriented to person, place, and time.  Skin: Skin is warm and dry.  Psychiatric: He has a normal mood and affect. His behavior is normal.  Nursing note and vitals reviewed.    ED Treatments / Results  Labs (all labs ordered are listed, but only abnormal results are displayed) Labs Reviewed  PROTIME-INR - Abnormal; Notable for the following:       Result Value   Prothrombin Time 28.7 (*)    All other components within normal limits  CBC WITH DIFFERENTIAL/PLATELET - Abnormal; Notable for the following:    Hemoglobin 12.8 (*)    All other components within normal limits  COMPREHENSIVE METABOLIC PANEL - Abnormal; Notable for the following:    CO2 18 (*)    Glucose, Bld 126 (*)    BUN 52 (*)    Creatinine, Ser 1.98 (*)    Total Protein 6.3 (*)    AST 60 (*)    GFR calc non Af Amer 29 (*)    GFR calc Af Amer 34 (*)    All other components within normal limits    EKG  EKG Interpretation None       Radiology Dg Chest 2 View  Result Date: 09/06/2017 CLINICAL DATA:  Atrial flutter. EXAM: CHEST  2 VIEW COMPARISON:  None. FINDINGS: Postsurgical changes related to prior CABG. Cardiomegaly. Prior aortic valve replacement. Normal pulmonary vascularity. Atherosclerotic calcification of the aortic arch. Bibasilar atelectasis. No focal consolidation, pleural effusion, or pneumothorax. No acute osseous abnormality. IMPRESSION: Cardiomegaly.  No active cardiopulmonary disease. Electronically Signed   By: Obie Dredge M.D.   On:  09/06/2017 14:00    Procedures .Sedation Date/Time: 09/06/2017 1:09 PM Performed by: Tilden Fossa Authorized by: Tilden Fossa   Consent:    Consent obtained:  Verbal   Consent given by:  Patient   Risks discussed:  Allergic reaction, dysrhythmia, inadequate sedation, nausea and respiratory compromise necessitating ventilatory assistance and intubation Indications:    Procedure performed:  Cardioversion   Procedure necessitating sedation performed by:  Physician performing sedation   Intended level of sedation:  Moderate (conscious sedation) Pre-sedation assessment:    Time since last food or drink:  Last night   ASA classification: class 3 - patient with severe systemic disease     Mallampati score:  II - soft palate, uvula, fauces  visible   Pre-sedation assessments completed and reviewed: airway patency, cardiovascular function, hydration status, mental status and respiratory function   Immediate pre-procedure details:    Reassessment: Patient reassessed immediately prior to procedure     Reviewed: vital signs     Verified: bag valve mask available, emergency equipment available, intubation equipment available, IV patency confirmed, oxygen available and suction available   Procedure details (see MAR for exact dosages):    Preoxygenation:  Nasal cannula   Sedation:  Propofol   Total Provider sedation time (minutes):  13 Post-procedure details:    Attendance: Constant attendance by certified staff until patient recovered     Recovery: Patient returned to pre-procedure baseline     Post-sedation assessments completed and reviewed: airway patency, cardiovascular function, hydration status, mental status and nausea/vomiting     Patient is stable for discharge or admission: yes     Patient tolerance:  Tolerated well, no immediate complications .Cardioversion Date/Time: 09/06/2017 1:11 PM Performed by: Tilden Fossa Authorized by: Tilden Fossa   Consent:    Consent  obtained:  Verbal   Consent given by:  Patient   Risks discussed:  Cutaneous burn, death, induced arrhythmia and pain Pre-procedure details:    Cardioversion basis:  Emergent   Rhythm:  Atrial flutter   Electrode placement:  Anterior-posterior Patient sedated: Yes. Refer to sedation procedure documentation for details of sedation.  Attempt one:    Cardioversion mode:  Synchronous   Shock (Joules):  200   Shock outcome:  Conversion to normal sinus rhythm Post-procedure details:    Patient status:  Awake   Patient tolerance of procedure:  Tolerated well, no immediate complications   (including critical care time) This patients CHA2DS2-VASc Score and unadjusted Ischemic Stroke Rate (% per year) is equal to 7.2 % stroke rate/year from a score of 5  Above score calculated as 1 point each if present [CHF, HTN, DM, Vascular=MI/PAD/Aortic Plaque, Age if 65-74, or Male] Above score calculated as 2 points each if present [Age > 75, or Stroke/TIA/TE]        Medications Ordered in ED Medications  adenosine (ADENOCARD) 6 MG/2ML injection 6 mg (6 mg Intravenous Given 09/06/17 1210)  adenosine (ADENOCARD) 6 MG/2ML injection (12 mg  Given 09/06/17 1220)  propofol (DIPRIVAN) 10 mg/mL bolus/IV push 33.4 mg (33.4 mg Intravenous Given 09/06/17 1255)     Initial Impression / Assessment and Plan / ED Course  I have reviewed the triage vital signs and the nursing notes.  Pertinent labs & imaging results that were available during my care of the patient were reviewed by me and considered in my medical decision making (see chart for details).     Patient with history of aortic valve replacement, CHF here for evaluation of asymptomatic tachycardia.  EKG with wide complex tachycardia, likely atrial flutter with variable block.  He was given adenosine x2 with no change in his underlying rhythm.  He was cardioverted with 200 J with return to sinus rhythm.  He has been evaluated by cardiology with plan  to discharge home with outpatient follow-up.  There is no evidence of acute coronary syndrome, acute CHF exacerbation or significant electrolyte abnormality triggering current episode.   Final Clinical Impressions(s) / ED Diagnoses   Final diagnoses:  Atrial flutter with rapid ventricular response [facility])    New Prescriptions Discharge Medication List as of 09/06/2017  4:03 PM       Tilden Fossa, MD 09/06/17 1708

## 2017-09-06 NOTE — ED Notes (Signed)
Patient transported to X-ray 

## 2017-09-06 NOTE — ED Notes (Signed)
No change in cardiac rhythm after administration of adenosine

## 2017-09-06 NOTE — ED Notes (Signed)
Patient verbalizes understanding of discharge instructions. Opportunity for questioning and answers were provided. 

## 2017-09-06 NOTE — Progress Notes (Signed)
Christopher LeedsWilliam Mann Date of Birth: [birth year]   History of Present Illness: [name] is seen for follow up CHF and AVR. He has a known history of valvular heart disease. He is status post redo aortic valve replacement in 1996 with a [facility]. He also has a history of coronary disease with a remote coronary stent. He has a history of dilated cardiomyopathy with ejection fraction of 20-25% by echocardiogram in April 2017. He is on chronic Coumadin therapy. He has a history of HTN and HLD.   He is seen for routine follow up today. He reports he is feeling well from a cardiac standpoint. He is  Active and has been helping his son clean out some old houses. He thinks his HR was a little fast over the last few days but he isn't sure. He denies any chest pain, dyspnea, dizziness, fatigue, syncope, orthopnea, or PND. He denies dyspnea, increase in edema or orthopnea. He does have chronic clear sinus drainage. His wife notes his breathing is a little labored when he is asleep and may have some apneic spells.   Current Outpatient Prescriptions on File Prior to Visit  Medication Sig Dispense Refill  . losartan (COZAAR) 100 MG tablet Take 1 tablet (100 mg total) by mouth daily. 90 tablet 3  . rosuvastatin (CRESTOR) 5 MG tablet TAKE 1 TABLET BY MOUTH 3 TIMES PER WEEK 36 tablet 4  . Saw Palmetto, Serenoa repens, (SAW PALMETTO PO) Take by mouth.      . spironolactone (ALDACTONE) 25 MG tablet Take 1 tablet (25 mg total) by mouth daily. 90 tablet 3  . tamsulosin (FLOMAX) 0.4 MG CAPS capsule Take 0.4 mg by mouth daily as needed.    . warfarin (COUMADIN) 3 MG tablet Take 3 mg by mouth daily. Take as Directed      No current facility-administered medications on file prior to visit.     No Known Allergies  Past Medical History:  Diagnosis Date  . Aortic stenosis    a. s/p redo aortic valve replacement in 1996 with mechanical prosthesis. On Coumadin  . Bundle branch block, left    . Chronic combined systolic and diastolic CHF (congestive heart failure) (HCC)    a. 2012: Echo showing EF of 25-30% b. 02/2016: Echo with EF of 20-25%, Grade 2 DD, mechanical AV functioning normally, PA Pressure 37 mm Hg  . Coronary artery disease 05/1995   a. s/p PCI with stent placement in 1996.  . Dilated cardiomyopathy (HCC)   . Elevated fasting blood sugar   . Hypercholesterolemia   . Hypertension     Past Surgical History:  Procedure Laterality Date  . AORTIC VALVE REPLACEMENT  1996    History  Smoking Status  . Former Smoker  Smokeless Tobacco  . Never Used    History  Alcohol Use No    Family History  Problem Relation Age of Onset  . CAD Father     Review of Systems: As noted in history of present illness. All other systems were reviewed and are negative.  Physical Exam: BP 110/70   Pulse (!) 157   Ht 5\' 7"  (1.702 m)   Wt 147 lb (66.7 kg)   BMI 23.02 kg/m  GENERAL:  Well appearing elderly WM in NAD HEENT:  PERRL, EOMI, sclera are clear. Oropharynx is clear. NECK:  No jugular venous distention, carotid upstroke brisk and symmetric, no bruits, no thyromegaly or adenopathy LUNGS:  Clear to auscultation bilaterally CHEST:  Unremarkable HEART:  RRR but tachycardic,  PMI not displaced or sustained, loud mechanical S2, :  no rubs, no murmurs ABD:  Soft, nontender. BS +, no masses or bruits. No hepatomegaly, no splenomegaly EXT:  2 + pulses throughout, no edema, no cyanosis no clubbing SKIN:  Warm and dry.  No rashes NEURO:  Alert and oriented x 3. Cranial nerves II through XII intact. PSYCH:  Cognitively intact    LABORATORY DATA: Lab Results  Component Value Date   GLUCOSE 96 10/24/2016   CHOL 209 (H) 10/24/2016   TRIG 141 10/24/2016   HDL 38 (L) 10/24/2016   LDLDIRECT 109.7 12/11/2013   LDLCALC 143 (H) 10/24/2016   ALT 14 10/24/2016   AST 27 10/24/2016   NA 140 10/24/2016   K 4.8 10/24/2016   CL 108 10/24/2016   CREATININE 1.45 (H) 10/24/2016    BUN 39 (H) 10/24/2016   CO2 26 10/24/2016   HGBA1C 6.4 (H) 01/15/2015    Ecg today shows a wide complex tachycardia with rate 157. LBBB pattern. I suspect this is atrial flutter with 2:1 AV conduction. He has a chronic LBBB. I have personally reviewed and interpreted this study.  Labs reviewed from primary care. Findings:  INR on 01/27/16- 2.5 12/27/15: cholesterol 170, triglycerides 310, HDL 31, LDL 62. 08/03/15: CBC normal. BUN 31, creatinine 1.36. Potassium 4.3. Other chemistries OK. TSH. 4.68.  Echo 02/16/17: Study Conclusions  - Left ventricle: The cavity size was severely dilated. Systolic   function was severely reduced. The estimated ejection fraction   was in the range of 20% to 25%. Severe diffuse hypokinesis.   Features are consistent with a pseudonormal left ventricular   filling pattern, with concomitant abnormal relaxation and   increased filling pressure (grade 2 diastolic dysfunction).   Doppler parameters are consistent with high ventricular filling   pressure. - Ventricular septum: Septal motion showed moderate paradox. These   changes are consistent with intraventricular conduction delay. - Aortic valve: A mechanical prosthesis was present and functioning   normally. There was trivial regurgitation. - Aorta: Ascending aortic diameter: 51 mm (S). - Ascending aorta: The ascending aorta was moderately dilated. - Mitral valve: Calcified annulus. Mild diffuse thickening and   calcification. There was mild regurgitation. - Left atrium: The atrium was severely dilated. - Pulmonic valve: There was trivial regurgitation. - Pulmonary arteries: PA peak pressure: 37 mm Hg (S).  Impressions:  - The right ventricular systolic pressure was increased consistent   with mild pulmonary hypertension.   Assessment / Plan: 1. Wide complex tachycardia - I suspect this is atrial flutter with 2:1 AV conduction. He has been therapeutic on coumadin with INR 3.4, 2.3, and 3.7 over  the past month - most recent on October 19. Given his low EF I have recommended taking him to ED for further evaluation and treatment. Will check labs in ED. Consider IV adenosine to see if rhythm will slow or break. May be a candidate for DCCV if INR Ok. Further disposition per EDP. I have contacted our inpatient co-ordinator, the ED physician and triage.   3. Chronic systolic CHF with dilated cardiomyopathy. Ejection fraction of 20-25%.  Continue treatment with ARB and Aldactone.   3. Status post redo aortic valve replacement with mechanical prosthesis. Functioning well by echocardiogram in April 2018.   3. Hypertension, controlled.  4. Coronary disease with remote coronary stent. Asymptomatic.  5. Hyperlipidemia. on statin. LDL OK.   6. DM last A1c 6.2%. Glucose improved with diet.  Follow up TBD based on outcome of hospital evaluation.

## 2017-09-06 NOTE — Sedation Documentation (Signed)
Synchronized cardioversion with 200 joules

## 2017-09-06 NOTE — ED Triage Notes (Signed)
To ED for further eval/treatment of tachycardia. Sent from Dr Elvis Coil office. Pt went in there today for a routine check-up and found to have HR 170. Pt states he has noticed burping over the past couple of days but no pain and no sob.

## 2017-09-11 ENCOUNTER — Telehealth: Payer: Self-pay | Admitting: Cardiology

## 2017-09-11 NOTE — Progress Notes (Signed)
Cardiology Office Note    Date:  09/12/2017   ID:  Christopher LeedsWilliam Charnley, DOB [birth year], MRN [id number]  PCP:  Vivien Prestoorrington, Kip A, MD  Cardiologist: Dr. SwazilandJordan   Chief Complaint  Patient presents with  . Follow-up    shortness of breath     History of Present Illness:    Christopher Mann is a 83 y.o. male with past medical history of severe AS (s/p redo AVR with mechanical prosthesis in 1996, on Coumadin), CAD (s/p PCI in 1996), chronic systolic CHF (EF 78-29%20-25% by echo in 02/2016), chronic LBBB, HTN, and HLD who presents to the office today for follow-up.   He was recently evaluated by Dr. SwazilandJordan on 09/06/2017 and was noted to be a wide-complex tachycardia thought to be most consistent with atrial flutter with 2:1 AV Conduction with HR of 157. It was recommended he proceed to the ED for consideration of DCCV if INR was therapeutic. Labs were without acute abnormalities, therefore DCCV was pursued at 200J with successful conversion to NSR. He was not started on BB therapy due to his history of bradycardia. Antiarrhythmics were not initiated due to the associated prolonged hospitalizations required for this, his known reduced EF, and known CKD.   He called the office on 09/11/2017 reporting worsening shortness of breath but denying any palpitations, therefore a follow-up visit was recommended.   In talking with the patient today, he reports having episodes of dyspnea on exertion along with orthopnea and a dry cough ever since his recent cardioversion. He does not weigh himself daily but reports he feels his weight has been stable. Denies any abdominal distention or lower extremity edema. No recent chest discomfort or palpitations. He purchased a HR monitor and says his pulse has mostly been in the 60's - 80's.   He reports good compliance with his medications and denies missing any doses of Coumadin. Denies any evidence of active bleeding.   Past Medical History:  Diagnosis Date  . Aortic  stenosis    a. s/p redo aortic valve replacement in 1996 with mechanical prosthesis. On Coumadin  . Atrial flutter (HCC)    a. s/p DCCV in 08/2017  . Bundle branch block, left   . Chronic combined systolic and diastolic CHF (congestive heart failure) (HCC)    a. 2012: Echo showing EF of 25-30% b. 02/2016: Echo with EF of 20-25%, Grade 2 DD, mechanical AV functioning normally, PA Pressure 37 mm Hg  . Coronary artery disease 05/1995   a. s/p PCI with stent placement in 1996.  . Dilated cardiomyopathy (HCC)   . Elevated fasting blood sugar   . Hypercholesterolemia   . Hypertension     Past Surgical History:  Procedure Laterality Date  . AORTIC VALVE REPLACEMENT  1996    Current Medications: Outpatient Medications Prior to Visit  Medication Sig Dispense Refill  . acetaminophen (TYLENOL) 325 MG tablet Take 325-650 mg by mouth every 6 (six) hours as needed (for pain or headaches).    . losartan (COZAAR) 100 MG tablet Take 1 tablet (100 mg total) by mouth daily. 90 tablet 3  . OVER THE COUNTER MEDICATION Heart Health Multivitamin (name?): Take 1 tablet by mouth three times a day    . rosuvastatin (CRESTOR) 5 MG tablet TAKE 1 TABLET BY MOUTH 3 TIMES PER WEEK (Patient taking differently: Take 5 mg by mouth once a day on Mon/Wed/Fri) 36 tablet 4  . Saw Palmetto, Serenoa repens, (SAW PALMETTO PO) Take 1 tablet by mouth 3 (three)  times daily.     Marland Kitchen spironolactone (ALDACTONE) 25 MG tablet Take 1 tablet (25 mg total) by mouth daily. 90 tablet 3  . tamsulosin (FLOMAX) 0.4 MG CAPS capsule Take 0.4 mg by mouth daily as needed (for urinary symptoms).     . warfarin (COUMADIN) 3 MG tablet Take 1.5-3 mg by mouth See admin instructions. 3 mg in the morning on Sun/Mon/Wed/Thurs/Sat and 1.5 mg on Tues/Fri     No facility-administered medications prior to visit.      Allergies:   Bee venom   Social History   Social History  . Marital status: Married    Spouse name: N/A  . Number of children: N/A    . Years of education: N/A   Social History Main Topics  . Smoking status: Former Games developer  . Smokeless tobacco: Never Used  . Alcohol use No  . Drug use: No  . Sexual activity: Not Asked   Other Topics Concern  . None   Social History Narrative  . None     Family History:  The patient's family history includes CAD in his father.   Review of Systems:   Please see the history of present illness.     General:  No chills, fever, night sweats or weight changes.  Cardiovascular:  No chest pain, edema, palpitations, paroxysmal nocturnal dyspnea. Positive for dyspnea on exertion and orthopnea.  Dermatological: No rash, lesions/masses Respiratory: Positive for cough and dyspnea Urologic: No hematuria, dysuria Abdominal:   No nausea, vomiting, diarrhea, bright red blood per rectum, melena, or hematemesis Neurologic:  No visual changes, wkns, changes in mental status. All other systems reviewed and are otherwise negative except as noted above.   Physical Exam:    VS:  BP 130/78   Pulse 68   Ht 5\' 7"  (1.702 m)   Wt 146 lb (66.2 kg)   BMI 22.87 kg/m    General: Well developed, well nourished Caucasian male appearing in no acute distress. Head: Normocephalic, atraumatic, sclera non-icteric, no xanthomas, nares are without discharge.  Neck: No carotid bruits. JVD at 9cm.  Lungs: Respirations regular and unlabored, mild rales along left base. Heart: Regular rate and rhythm. No S3 or S4.  No murmur, no rubs, or gallops appreciated. Crisp valve sounds appreciated.  Abdomen: Soft, non-tender, non-distended with normoactive bowel sounds. No hepatomegaly. No rebound/guarding. No obvious abdominal masses. Msk:  Strength and tone appear normal for age. No joint deformities or effusions. Extremities: No clubbing or cyanosis. No lower extremity edema.  Distal pedal pulses are 2+ bilaterally. Neuro: Alert and oriented X 3. Moves all extremities spontaneously. No focal deficits noted. Psych:   Responds to questions appropriately with a normal affect. Skin: No rashes or lesions noted  Wt Readings from Last 3 Encounters:  09/12/17 146 lb (66.2 kg)  09/06/17 147 lb (66.7 kg)  03/14/17 138 lb (62.6 kg)      Studies/Labs Reviewed:   EKG:  EKG is ordered today.  The ekg ordered today demonstrates NSR, HR 68 with 1st degree AV Block and PAC's. Known LBBB.   Recent Labs: 09/06/2017: ALT 59; BUN 46; Creatinine, Ser 2.00; Hemoglobin 13.9; Platelets 195; Potassium 4.8; Sodium 140   Lipid Panel    Component Value Date/Time   CHOL 209 (H) 10/24/2016 0941   TRIG 141 10/24/2016 0941   HDL 38 (L) 10/24/2016 0941   CHOLHDL 5.5 (H) 10/24/2016 0941   VLDL 28 10/24/2016 0941   LDLCALC 143 (H) 10/24/2016 0941   LDLDIRECT 109.7  12/11/2013 1045    Additional studies/ records that were reviewed today include:   Echocardiogram: 02/2016 Study Conclusions  - Left ventricle: The cavity size was severely dilated. Systolic   function was severely reduced. The estimated ejection fraction   was in the range of 20% to 25%. Severe diffuse hypokinesis.   Features are consistent with a pseudonormal left ventricular   filling pattern, with concomitant abnormal relaxation and   increased filling pressure (grade 2 diastolic dysfunction).   Doppler parameters are consistent with high ventricular filling   pressure. - Ventricular septum: Septal motion showed moderate paradox. These   changes are consistent with intraventricular conduction delay. - Aortic valve: A mechanical prosthesis was present and functioning   normally. There was trivial regurgitation. - Aorta: Ascending aortic diameter: 51 mm (S). - Ascending aorta: The ascending aorta was moderately dilated. - Mitral valve: Calcified annulus. Mild diffuse thickening and   calcification. There was mild regurgitation. - Left atrium: The atrium was severely dilated. - Pulmonic valve: There was trivial regurgitation. - Pulmonary arteries: PA  peak pressure: 37 mm Hg (S).  Impressions:  - The right ventricular systolic pressure was increased consistent   with mild pulmonary hypertension.  Assessment:    1. Chronic combined systolic and diastolic CHF (congestive heart failure) (HCC)   2. Dilated cardiomyopathy (HCC)   3. Coronary artery disease involving native coronary artery of native heart without angina pectoris   4. S/P AVR   5. Typical atrial flutter (HCC)   6. Essential hypertension   7. Hyperlipidemia LDL goal <70      Plan:   In order of problems listed above:  1. Chronic Combined Systolic and Diastolic CHF/ Dilated Cardiomyopathy - the patient has a known reduced EF of 20-25% by echo in 02/2016. He reports worsening dyspnea on exertion along with orthopnea and a dry cough ever since his recent cardioversion. He does not weigh himself daily but reports he feels his weight has been stable. Denies any abdominal distention or lower extremity edema. - he does have elevated JVD along with mild rales on examination today. Will check BMET and BNP today. Recheck echocardiogram to assess LV function along with valve gradients. Start Lasix 20mg  daily for the next 5 days then as needed for weight gain. Sodium and fluid restriction reviewed with the patient.   2. CAD - s/p PCI in 1996. He denies any recent chest pain.  - continue statin therapy. No BB secondary to history of bradycardia. No ASA secondary to the need for Coumadin.   3. Aortic Stenosis - s/p redo AVR in 1996 with mechanical prosthesis. Remains on Coumadin for anticoagulation.  - plan for repeat echocardiogram to assess valve gradients.   4. Atrial Flutter - s/p DCCV on 09/06/2017. Maintaining NSR by EKG today.  - continue Coumadin for anticoagulation. Not on AV nodal blocking agents due to history of bradycardia.   5. HTN - BP is well-controlled at 130/78 during today's visit.  - continue Losartan 100mg  daily and Spironolactone 25mg  daily.   6.  HLD - Switched to Crestor 5mg  TID earlier this year. Unable to tolerate higher dosing due to myalgias.   Medication Adjustments/Labs and Tests Ordered: Current medicines are reviewed at length with the patient today.  Concerns regarding medicines are outlined above.  Medication changes, Labs and Tests ordered today are listed in the Patient Instructions below. Patient Instructions  Medication Instructions:  START: Lasix take 1 tablet daily for 5 days then take 1 tablet as needed  Labwork: Your physician recommends that you return for lab work in: TODAY-BNP, BMP  Testing/Procedures: Your physician has requested that you have an echocardiogram. Echocardiography is a painless test that uses sound waves to create images of your heart. It provides your doctor with information about the size and shape of your heart and how well your heart's chambers and valves are working. This procedure takes approximately one hour. There are no restrictions for this procedure.  [address]  Follow-Up: Keep your follow up appointment as scheduled   Any Other Special Instructions Will Be Listed Below (If Applicable).  If you need a refill on your cardiac medications before your next appointment, please call your pharmacy.   Signed, Ellsworth Lennox, PA-C  09/12/2017 4:02 PM    [facility] [address] [place], [address] [place], [place]  [address] Phone: [phone]; Fax: [phone]  [address], [address] [place], [place] [address] Phone: [phone]

## 2017-09-11 NOTE — Telephone Encounter (Signed)
Spoke to wife and patient. Wife states patient is having clear drainage  Everytime times he moves  Some shortness of breathe.  No irregular heart rate , no chest discomfort , patient has pulse oximetry - O2 sat 94 % and pulse 78. Wife would like to come to be evaluated RN recommend wife contact primary- from symptoms reported symptoms do not sem cardiac in nature. Wife states she will call pcp  And will call back if needed.

## 2017-09-11 NOTE — Telephone Encounter (Signed)
SPOKE TO DAUGHTER ,SHE  WOULD LIKE PATIENT TO BE SEEN  SOONER THAN 09/18/17 . SHE STATES PATIENT IS COMPLAINING SHORTNESS OF BREATHE MORE OFTEN.  INFORMED DAUGHTER RN SPOKE TO PATIENT AND WIFE EARLIER TODAY -  VAGUE SYMPTOMS WERE NOT NECESSARY CARDIAC IN ORIGIN  RECOMMENDED CONTACT PRIMARY. APPOINTMENT AVAILABLE TOMORROW AT 9:30 AM WITH EXTENDER.  DAUGHTER AGREED WITH APPOINTMENT.

## 2017-09-11 NOTE — Telephone Encounter (Signed)
New message   Pt daughter and wife calling for pt to speak to the rn  Pt has had a cardioversion   Pt c/o Shortness Of Breath: STAT if SOB developed within the last 24 hours or pt is noticeably SOB on the phone  1. Are you currently SOB (can you hear that pt is SOB on the phone)? No pt is not with them, at work    2. How long have you been experiencing SOB? Cardio pt has always has SOB but pt stated that since he has come home from hospital that it is different/ Last Thursday   3. Are you SOB when sitting or when up moving around? Up moving around   4. Are you currently experiencing any other symptoms? Not feeling well, has congestion

## 2017-09-11 NOTE — Telephone Encounter (Signed)
New message   Patient wife calling, states she is concerned about SOB.   Pt c/o Shortness Of Breath: STAT if SOB developed within the last 24 hours or pt is noticeably SOB on the phone  1. Are you currently SOB (can you hear that pt is SOB on the phone)? " a little"  2. How long have you been experiencing SOB? Since last night  3. Are you SOB when sitting or when up moving around? both  4. Are you currently experiencing any other symptoms? No

## 2017-09-12 ENCOUNTER — Encounter: Payer: Self-pay | Admitting: Student

## 2017-09-12 ENCOUNTER — Ambulatory Visit (INDEPENDENT_AMBULATORY_CARE_PROVIDER_SITE_OTHER): Payer: Medicare Other | Admitting: Student

## 2017-09-12 VITALS — BP 130/78 | HR 68 | Ht 67.0 in | Wt 146.0 lb

## 2017-09-12 DIAGNOSIS — E785 Hyperlipidemia, unspecified: Secondary | ICD-10-CM | POA: Diagnosis not present

## 2017-09-12 DIAGNOSIS — I10 Essential (primary) hypertension: Secondary | ICD-10-CM

## 2017-09-12 DIAGNOSIS — I2510 Atherosclerotic heart disease of native coronary artery without angina pectoris: Secondary | ICD-10-CM | POA: Diagnosis not present

## 2017-09-12 DIAGNOSIS — Z952 Presence of prosthetic heart valve: Secondary | ICD-10-CM

## 2017-09-12 DIAGNOSIS — I420 Dilated cardiomyopathy: Secondary | ICD-10-CM

## 2017-09-12 DIAGNOSIS — I5042 Chronic combined systolic (congestive) and diastolic (congestive) heart failure: Secondary | ICD-10-CM

## 2017-09-12 DIAGNOSIS — I483 Typical atrial flutter: Secondary | ICD-10-CM | POA: Diagnosis not present

## 2017-09-12 MED ORDER — FUROSEMIDE 20 MG PO TABS: ORAL_TABLET | ORAL | 3 refills | Status: DC

## 2017-09-12 NOTE — Patient Instructions (Addendum)
Medication Instructions:  START: Lasix take 1 tablet daily for 5 days then take 1 tablet as needed   Labwork: Your physician recommends that you return for lab work in: TODAY-BNP, BMP  Testing/Procedures: Your physician has requested that you have an echocardiogram. Echocardiography is a painless test that uses sound waves to create images of your heart. It provides your doctor with information about the size and shape of your heart and how well your heart's chambers and valves are working. This procedure takes approximately one hour. There are no restrictions for this procedure.  [address]  Follow-Up: Keep your follow up appointment as scheduled   Any Other Special Instructions Will Be Listed Below (If Applicable).  If you need a refill on your cardiac medications before your next appointment, please call your pharmacy.

## 2017-09-13 ENCOUNTER — Ambulatory Visit (HOSPITAL_COMMUNITY): Payer: Medicare Other | Attending: Cardiovascular Disease

## 2017-09-13 ENCOUNTER — Other Ambulatory Visit: Payer: Self-pay

## 2017-09-13 DIAGNOSIS — I4892 Unspecified atrial flutter: Secondary | ICD-10-CM | POA: Diagnosis not present

## 2017-09-13 DIAGNOSIS — E7800 Pure hypercholesterolemia, unspecified: Secondary | ICD-10-CM | POA: Diagnosis not present

## 2017-09-13 DIAGNOSIS — I420 Dilated cardiomyopathy: Secondary | ICD-10-CM | POA: Diagnosis not present

## 2017-09-13 DIAGNOSIS — Z952 Presence of prosthetic heart valve: Secondary | ICD-10-CM | POA: Insufficient documentation

## 2017-09-13 DIAGNOSIS — I5042 Chronic combined systolic (congestive) and diastolic (congestive) heart failure: Secondary | ICD-10-CM

## 2017-09-13 DIAGNOSIS — I447 Left bundle-branch block, unspecified: Secondary | ICD-10-CM | POA: Insufficient documentation

## 2017-09-13 DIAGNOSIS — I110 Hypertensive heart disease with heart failure: Secondary | ICD-10-CM | POA: Diagnosis not present

## 2017-09-13 DIAGNOSIS — I340 Nonrheumatic mitral (valve) insufficiency: Secondary | ICD-10-CM | POA: Insufficient documentation

## 2017-09-13 LAB — BASIC METABOLIC PANEL
BUN / CREAT RATIO: 17 (ref 10–24)
BUN: 24 mg/dL (ref 8–27)
CALCIUM: 9.1 mg/dL (ref 8.6–10.2)
CO2: 21 mmol/L (ref 20–29)
Chloride: 107 mmol/L — ABNORMAL HIGH (ref 96–106)
Creatinine, Ser: 1.43 mg/dL — ABNORMAL HIGH (ref 0.76–1.27)
GFR, EST AFRICAN AMERICAN: 51 mL/min/{1.73_m2} — AB (ref >59)
GFR, EST NON AFRICAN AMERICAN: 44 mL/min/{1.73_m2} — AB (ref >59)
Glucose: 101 mg/dL — ABNORMAL HIGH (ref 65–99)
Potassium: 4.6 mmol/L (ref 3.5–5.2)
Sodium: 142 mmol/L (ref 134–144)

## 2017-09-13 LAB — BRAIN NATRIURETIC PEPTIDE: BNP: 1240.2 pg/mL — ABNORMAL HIGH (ref 0.0–100.0)

## 2017-09-16 NOTE — Progress Notes (Signed)
Cardiology Office Note    Date:  09/18/2017   ID:  Christopher Mann, DOB [birth year], MRN [id number]  PCP:  Vivien Presto, MD  Cardiologist: Dr. Swaziland    Chief Complaint  Patient presents with  . Follow-up    1 week visit    History of Present Illness:    Christopher Mann is a 83 y.o. male  with past medical history of severe AS (s/p redo AVR with mechanical prosthesis in 1996, on Coumadin), CAD (s/p PCI in 1996), PAflutter (s/p DCCV on 09/06/2017), chronic systolic CHF (EF 78-29% by echo in 09/2017), chronic LBBB, HTN, and HLD who presents to the office today for 1-week follow-up.   He was recently evaluated by Dr. Swaziland on 09/06/2017 and was noted to be a wide-complex tachycardia but thought to be most consistent with atrial flutter with 2:1 AV Conduction with HR of 157. It was recommended he proceed to the ED for consideration of DCCV if INR was therapeutic. DCCV was pursued at 200J with successful conversion to NSR. He was not started on BB therapy due to his history of bradycardia.   At the time of his follow-up office visit on 09/12/2017, he reported worsening dyspnea on exertion and orthopnea since his recent DCCV. A BNP was checked and found to be elevated to 1240. He was started on Lasix 20mg  daily for the next 5 days then as needed for weight gain.    In talking with the patient today, he reports significant improvement in his symptoms since his last office visit.  He notes that breathing is now at baseline and denies any orthopnea, PND, or lower extremity edema.  Weight has declined from 146 lbs at the time of his visit on 10/31 to 139 lbs today. Notes similar improvement on his home scales. Denies any recent chest pain, palpitations, lightheadedness, dizziness, or presyncope. He has been wearing an i-watch to monitor his HR and this has remained stable in the 50's - 60's at rest.   Past Medical History:  Diagnosis Date  . Aortic stenosis    a. s/p redo aortic valve  replacement in 1996 with mechanical prosthesis. On Coumadin  . Atrial flutter (HCC)    a. s/p DCCV in 08/2017  . Bundle branch block, left   . Chronic combined systolic and diastolic CHF (congestive heart failure) (HCC)    a. 2012: Echo showing EF of 25-30% b. 02/2016: Echo with EF of 20-25%, Grade 2 DD, mechanical AV functioning normally, PA Pressure 37 mm Hg  . Coronary artery disease 05/1995   a. s/p PCI with stent placement in 1996.  . Dilated cardiomyopathy (HCC)   . Elevated fasting blood sugar   . Hypercholesterolemia   . Hypertension     Past Surgical History:  Procedure Laterality Date  . AORTIC VALVE REPLACEMENT  1996    Current Medications: Outpatient Medications Prior to Visit  Medication Sig Dispense Refill  . acetaminophen (TYLENOL) 325 MG tablet Take 325-650 mg by mouth every 6 (six) hours as needed (for pain or headaches).    . furosemide (LASIX) 20 MG tablet Take 20 mg as needed by mouth for fluid or edema.    Marland Kitchen losartan (COZAAR) 100 MG tablet Take 1 tablet (100 mg total) by mouth daily. 90 tablet 3  . OVER THE COUNTER MEDICATION Heart Health Multivitamin (name?): Take 1 tablet by mouth three times a day    . rosuvastatin (CRESTOR) 5 MG tablet TAKE 1 TABLET BY MOUTH 3 TIMES PER  WEEK (Patient taking differently: Take 5 mg by mouth once a day on Mon/Wed/Fri) 36 tablet 4  . Saw Palmetto, Serenoa repens, (SAW PALMETTO PO) Take 1 tablet by mouth 3 (three) times daily.     Marland Kitchen spironolactone (ALDACTONE) 25 MG tablet Take 1 tablet (25 mg total) by mouth daily. 90 tablet 3  . tamsulosin (FLOMAX) 0.4 MG CAPS capsule Take 0.4 mg by mouth daily as needed (for urinary symptoms).     . warfarin (COUMADIN) 3 MG tablet Take 1.5-3 mg by mouth See admin instructions. 3 mg in the morning on Sun/Mon/Wed/Thurs/Sat and 1.5 mg on Tues/Fri    . furosemide (LASIX) 20 MG tablet Take 1 tablet daily for 5 days then as needed (Patient not taking: Reported on 09/18/2017) 30 tablet 3   No  facility-administered medications prior to visit.      Allergies:   Bee venom   Social History   Socioeconomic History  . Marital status: Married    Spouse name: None  . Number of children: None  . Years of education: None  . Highest education level: None  Social Needs  . Financial resource strain: None  . Food insecurity - worry: None  . Food insecurity - inability: None  . Transportation needs - medical: None  . Transportation needs - non-medical: None  Occupational History  . None  Tobacco Use  . Smoking status: Former Games developer  . Smokeless tobacco: Never Used  Substance and Sexual Activity  . Alcohol use: No    Alcohol/week: 0.0 oz  . Drug use: No  . Sexual activity: None  Other Topics Concern  . None  Social History Narrative  . None     Family History:  The patient's family history includes CAD in his father.   Review of Systems:   Please see the history of present illness.     General:  No chills, fever, night sweats or weight changes.  Cardiovascular:  No chest pain, dyspnea on exertion, edema, orthopnea, palpitations, paroxysmal nocturnal dyspnea. Dermatological: No rash, lesions/masses Respiratory: No dyspnea. Positive for dry cough.  Urologic: No hematuria, dysuria Abdominal:   No nausea, vomiting, diarrhea, bright red blood per rectum, melena, or hematemesis Neurologic:  No visual changes, wkns, changes in mental status.  All other systems reviewed and are otherwise negative except as noted above.   Physical Exam:    VS:  BP 118/60   Pulse 65   Ht 5\' 7"  (1.702 m)   Wt 139 lb (63 kg)   SpO2 98%   BMI 21.77 kg/m    General: Well developed, well nourished Caucasian male appearing in no acute distress. Head: Normocephalic, atraumatic, sclera non-icteric, no xanthomas, nares are without discharge.  Neck: No carotid bruits. JVD not elevated.  Lungs: Respirations regular and unlabored, without wheezes or rales.  Heart: Regular rate and rhythm. No S3  or S4.  No murmur, no rubs, or gallops appreciated. Crisp mechanical valve sounds appreciated.  Abdomen: Soft, non-tender, non-distended with normoactive bowel sounds. No hepatomegaly. No rebound/guarding. No obvious abdominal masses. Msk:  Strength and tone appear normal for age. No joint deformities or effusions. Extremities: No clubbing or cyanosis. No lower extremity edema.  Distal pedal pulses are 2+ bilaterally. Neuro: Alert and oriented X 3. Moves all extremities spontaneously. No focal deficits noted. Psych:  Responds to questions appropriately with a normal affect. Skin: No rashes or lesions noted  Wt Readings from Last 3 Encounters:  09/18/17 139 lb (63 kg)  09/12/17 146  lb (66.2 kg)  09/06/17 147 lb (66.7 kg)     Studies/Labs Reviewed:   EKG:  EKG is not ordered today.    Recent Labs: 09/06/2017: ALT 59; Hemoglobin 13.9; Platelets 195 09/12/2017: BNP 1,240.2; BUN 24; Creatinine, Ser 1.43; Potassium 4.6; Sodium 142   Lipid Panel    Component Value Date/Time   CHOL 209 (H) 10/24/2016 0941   TRIG 141 10/24/2016 0941   HDL 38 (L) 10/24/2016 0941   CHOLHDL 5.5 (H) 10/24/2016 0941   VLDL 28 10/24/2016 0941   LDLCALC 143 (H) 10/24/2016 0941   LDLDIRECT 109.7 12/11/2013 1045    Additional studies/ records that were reviewed today include:   Echocardiogram: 09/13/2017 Study Conclusions  - Left ventricle: The cavity size was moderately dilated. Wall   thickness was increased in a pattern of moderate LVH. Systolic   function was severely reduced. The estimated ejection fraction   was in the range of 20% to 25%. Features are consistent with a   pseudonormal left ventricular filling pattern, with concomitant   abnormal relaxation and increased filling pressure (grade 2   diastolic dysfunction). - Aortic valve: A mechanical prosthesis was present. There was   trivial regurgitation. - Mitral valve: There was mild regurgitation. - Left atrium: The atrium was severely  dilated. - Right atrium: The atrium was mildly dilated. - Pulmonary arteries: Systolic pressure was mildly increased. PA   peak pressure: 36 mm Hg (S).  Assessment:    1. Chronic combined systolic and diastolic CHF (congestive heart failure) (HCC)   2. Typical atrial flutter (HCC)   3. S/P AVR   4. Coronary artery disease involving native coronary artery of native heart without angina pectoris   5. Essential hypertension   6. Hyperlipidemia LDL goal <70      Plan:   In order of problems listed above:  1. Chronic Combined Systolic and Diastolic CHF - the patient has a known reduced EF of 20-25% by echo in 09/2017, which is similar to prior tracings.  - he was volume overloaded at the time of his last office visit and found to have a BNP of 1240. Was prescribed Lasix 20mg  daily for the next 5 days then as needed for weight gain.   - weight has declined 7 lbs (146 --> 139 lbs) and he reports resolution of his dyspnea and orthopnea. He does not appear volume overloaded by physical examination.  - will continue with PRN Lasix dosing for weight gain of > 3 lbs overnight or > 5 lbs in one week. Continue Losartan and Spironolactone. Not on BB therapy due to history of bradycardia. Not an ideal Entresto candidate with his variable renal function and borderline hypotension.   2. Typical Atrial Flutter - s/p DCCV on 09/06/2017. He denies any recurrent palpitations.  - continue Coumadin for anticoagulation.   3. Aortic Stenosis - s/p redo AVR with mechanical prosthesis in 1996. - recent echocardiogram showed mechanical prosthesis was functioning normally with trivial AI.   4. CAD - s/p PCI in 1996. - he denies any recent chest discomfort or dyspnea on exertion.  - continue statin therapy. No ASA secondary to the need for Coumadin and not on BB therapy due to bradycardia.   5. HTN - BP is well-controlled at 118/60 during today's visit. - continue current medication regimen.  6. HLD -  on Crestor 5mg  three times weekly. Intolerant to higher-dosing.  - will recheck FLP and LFT's today.   Medication Adjustments/Labs and Tests Ordered: Current medicines  are reviewed at length with the patient today.  Concerns regarding medicines are outlined above.  Medication changes, Labs and Tests ordered today are listed in the Patient Instructions below. Patient Instructions  Medication Instructions: Your physician recommends that you continue on your current medications as directed. Please refer to the Current Medication list given to you today.  Labwork: Your physician recommends that you have lab work today : Fasting Lipid  Procedures/Testing: None Ordered  Follow-Up: Your physician recommends that you keep your follow-up appointment as scheduled in February with Dr. Swaziland.   If you need a refill on your cardiac medications before your next appointment, please call your pharmacy.    Signed, Ellsworth Lennox, PA-C  09/18/2017 12:07 PM    [facility] [address] [place], [address] [place], [place]  [address] Phone: [phone]; Fax: [phone]  [address] [place], [place] [address] Phone: [phone]

## 2017-09-17 ENCOUNTER — Ambulatory Visit: Payer: Medicare Other | Admitting: Cardiology

## 2017-09-18 ENCOUNTER — Ambulatory Visit (INDEPENDENT_AMBULATORY_CARE_PROVIDER_SITE_OTHER): Payer: Medicare Other | Admitting: Student

## 2017-09-18 ENCOUNTER — Encounter: Payer: Self-pay | Admitting: Student

## 2017-09-18 VITALS — BP 118/60 | HR 65 | Ht 67.0 in | Wt 139.0 lb

## 2017-09-18 DIAGNOSIS — I483 Typical atrial flutter: Secondary | ICD-10-CM

## 2017-09-18 DIAGNOSIS — Z952 Presence of prosthetic heart valve: Secondary | ICD-10-CM | POA: Diagnosis not present

## 2017-09-18 DIAGNOSIS — E785 Hyperlipidemia, unspecified: Secondary | ICD-10-CM

## 2017-09-18 DIAGNOSIS — I10 Essential (primary) hypertension: Secondary | ICD-10-CM

## 2017-09-18 DIAGNOSIS — I5042 Chronic combined systolic (congestive) and diastolic (congestive) heart failure: Secondary | ICD-10-CM | POA: Diagnosis not present

## 2017-09-18 DIAGNOSIS — I2510 Atherosclerotic heart disease of native coronary artery without angina pectoris: Secondary | ICD-10-CM

## 2017-09-18 LAB — LIPID PANEL
CHOLESTEROL TOTAL: 168 mg/dL (ref 100–199)
Chol/HDL Ratio: 4.4 ratio (ref 0.0–5.0)
HDL: 38 mg/dL — AB (ref >39)
LDL CALC: 114 mg/dL — AB (ref 0–99)
TRIGLYCERIDES: 79 mg/dL (ref 0–149)
VLDL CHOLESTEROL CAL: 16 mg/dL (ref 5–40)

## 2017-09-18 NOTE — Patient Instructions (Addendum)
Medication Instructions: Your physician recommends that you continue on your current medications as directed. Please refer to the Current Medication list given to you today.  Labwork: Your physician recommends that you have lab work today : Fasting Lipid  Procedures/Testing: None Ordered  Follow-Up: Your physician recommends that you keep your follow-up appointment as scheduled in February with Dr. Swaziland.    If you need a refill on your cardiac medications before your next appointment, please call your pharmacy.

## 2017-10-23 ENCOUNTER — Telehealth: Payer: Self-pay | Admitting: Physician Assistant

## 2017-10-23 ENCOUNTER — Emergency Department (HOSPITAL_COMMUNITY)
Admission: EM | Admit: 2017-10-23 | Discharge: 2017-10-23 | Disposition: A | Discharge status: Home / Self Care | Payer: Medicare Other | Attending: Emergency Medicine | Admitting: Emergency Medicine

## 2017-10-23 ENCOUNTER — Encounter (HOSPITAL_COMMUNITY): Payer: Self-pay | Admitting: Emergency Medicine

## 2017-10-23 ENCOUNTER — Emergency Department (HOSPITAL_COMMUNITY): Payer: Medicare Other

## 2017-10-23 ENCOUNTER — Other Ambulatory Visit: Payer: Self-pay

## 2017-10-23 DIAGNOSIS — Z79899 Other long term (current) drug therapy: Secondary | ICD-10-CM | POA: Diagnosis not present

## 2017-10-23 DIAGNOSIS — Z91030 Bee allergy status: Secondary | ICD-10-CM | POA: Insufficient documentation

## 2017-10-23 DIAGNOSIS — I5042 Chronic combined systolic (congestive) and diastolic (congestive) heart failure: Secondary | ICD-10-CM | POA: Insufficient documentation

## 2017-10-23 DIAGNOSIS — Z7901 Long term (current) use of anticoagulants: Secondary | ICD-10-CM | POA: Insufficient documentation

## 2017-10-23 DIAGNOSIS — R079 Chest pain, unspecified: Secondary | ICD-10-CM

## 2017-10-23 DIAGNOSIS — I4892 Unspecified atrial flutter: Secondary | ICD-10-CM | POA: Insufficient documentation

## 2017-10-23 DIAGNOSIS — I2510 Atherosclerotic heart disease of native coronary artery without angina pectoris: Secondary | ICD-10-CM | POA: Insufficient documentation

## 2017-10-23 DIAGNOSIS — R000 Tachycardia, unspecified: Secondary | ICD-10-CM | POA: Diagnosis present

## 2017-10-23 DIAGNOSIS — Z87891 Personal history of nicotine dependence: Secondary | ICD-10-CM | POA: Diagnosis not present

## 2017-10-23 DIAGNOSIS — I110 Hypertensive heart disease with heart failure: Secondary | ICD-10-CM | POA: Diagnosis not present

## 2017-10-23 LAB — CBC
HEMATOCRIT: 39.9 % (ref 39.0–52.0)
HEMOGLOBIN: 13 g/dL (ref 13.0–17.0)
MCH: 30.4 pg (ref 26.0–34.0)
MCHC: 32.6 g/dL (ref 30.0–36.0)
MCV: 93.2 fL (ref 78.0–100.0)
Platelets: 214 10*3/uL (ref 150–400)
RBC: 4.28 MIL/uL (ref 4.22–5.81)
RDW: 14.1 % (ref 11.5–15.5)
WBC: 6.9 10*3/uL (ref 4.0–10.5)

## 2017-10-23 LAB — BASIC METABOLIC PANEL
Anion gap: 10 (ref 5–15)
BUN: 32 mg/dL — ABNORMAL HIGH (ref 6–20)
CHLORIDE: 109 mmol/L (ref 101–111)
CO2: 21 mmol/L — ABNORMAL LOW (ref 22–32)
Calcium: 9 mg/dL (ref 8.9–10.3)
Creatinine, Ser: 1.63 mg/dL — ABNORMAL HIGH (ref 0.61–1.24)
GFR calc non Af Amer: 37 mL/min — ABNORMAL LOW (ref >60)
GFR, EST AFRICAN AMERICAN: 43 mL/min — AB (ref >60)
Glucose, Bld: 116 mg/dL — ABNORMAL HIGH (ref 65–99)
POTASSIUM: 4.7 mmol/L (ref 3.5–5.1)
SODIUM: 140 mmol/L (ref 135–145)

## 2017-10-23 LAB — PROTIME-INR
INR: 2.75
Prothrombin Time: 28.9 seconds — ABNORMAL HIGH (ref 11.4–15.2)

## 2017-10-23 MED ORDER — ADENOSINE 6 MG/2ML IV SOLN
12.0000 mg | Freq: Once | INTRAVENOUS | Status: AC
  Administered 2017-10-23: 12 mg via INTRAVENOUS

## 2017-10-23 MED ORDER — PROPOFOL 10 MG/ML IV BOLUS
INTRAVENOUS | Status: AC | PRN
  Administered 2017-10-23: 3 mg via INTRAVENOUS

## 2017-10-23 MED ORDER — PROPOFOL 10 MG/ML IV BOLUS: 1.0000 mg/kg | Freq: Once | INTRAVENOUS | Status: DC

## 2017-10-23 MED ORDER — PROPOFOL 10 MG/ML IV BOLUS
INTRAVENOUS | Status: AC
  Filled 2017-10-23: qty 20

## 2017-10-23 MED ORDER — ETOMIDATE 2 MG/ML IV SOLN
10.0000 mg | Freq: Once | INTRAVENOUS | Status: DC
Start: 2017-10-23 — End: 2017-10-23
  Filled 2017-10-23: qty 10

## 2017-10-23 MED ORDER — ADENOSINE 6 MG/2ML IV SOLN
6.0000 mg | Freq: Once | INTRAVENOUS | Status: AC
  Administered 2017-10-23: 6 mg via INTRAVENOUS
  Filled 2017-10-23: qty 2

## 2017-10-23 NOTE — Telephone Encounter (Signed)
Paged by answering service.  Noted heart rate of 150-170s for the past 1 hour.  Asymptomatic.  Similar to prior wide-complex tachycardia.  Patient on his way to emergency room.  Someone is driving.  Advised to go [facility].

## 2017-10-23 NOTE — Progress Notes (Signed)
RT on stand-by at bedside for cardioversion. No complications.

## 2017-10-23 NOTE — ED Provider Notes (Signed)
[facility] EMERGENCY DEPARTMENT Provider Note   CSN: [id number] Arrival date & time: 10/23/17  1738     History   Chief Complaint Chief Complaint  Patient presents with  . Tachycardia    HPI Christopher Mann is a 83 y.o. male.  HPI  83 y.o. male with a history of severe AS (s/p redo AVR with mechanical prosthesis in 1996, on Coumadin), CAD (s/p PCI in 1996), chronic systolic CHF (EF 40-98% by echo in 02/2016), chronic LBBB, HTN, and HLD, presents to the Emergency Department today due to palpitations. Notes earlier today around 1500 when he saw his elevated HR on his apple watch. States that he was shoveling snow at that time. Denies CP/SOB. No N/V. No diaphoresis. No numbness/tingling. No cough/congestion. No fevers. Pt asymptomatic currently. Seen in ED on 09-06-17 for same. X2 adenosine with minimal effect. Pt was cardioverted with 200J under propofol with conversion to NSR.  He has a past history of bradycardia with beta blockers. Due to low EF the only appropriate antiarrhythmic medications with the dofetilide or amiodarone. No other symptoms noted   Cardiologist- Dr. Swaziland  Past Medical History:  Diagnosis Date  . Aortic stenosis    a. s/p redo aortic valve replacement in 1996 with mechanical prosthesis. On Coumadin  . Atrial flutter (HCC)    a. s/p DCCV in 08/2017  . Bundle branch block, left   . Chronic combined systolic and diastolic CHF (congestive heart failure) (HCC)    a. 2012: Echo showing EF of 25-30% b. 02/2016: Echo with EF of 20-25%, Grade 2 DD, mechanical AV functioning normally, PA Pressure 37 mm Hg  . Coronary artery disease 05/1995   a. s/p PCI with stent placement in 1996.  . Dilated cardiomyopathy (HCC)   . Elevated fasting blood sugar   . Hypercholesterolemia   . Hypertension     Patient Active Problem List   Diagnosis Date Noted  . Elevated fasting blood sugar 07/01/2014  . S/P AVR 07/25/2012  . Severe aortic valve stenosis   .  Bundle branch block, left   . Hypertension   . Dilated cardiomyopathy (HCC)   . Hypercholesterolemia   . Coronary artery disease 05/14/1995    Past Surgical History:  Procedure Laterality Date  . AORTIC VALVE REPLACEMENT  1996       Home Medications    Prior to Admission medications   Medication Sig Start Date End Date Taking? Authorizing Provider  acetaminophen (TYLENOL) 325 MG tablet Take 325-650 mg by mouth every 6 (six) hours as needed (for pain or headaches).    [provider]  furosemide (LASIX) 20 MG tablet Take 20 mg as needed by mouth for fluid or edema.    [provider]  losartan (COZAAR) 100 MG tablet Take 1 tablet (100 mg total) by mouth daily. 07/01/14   Swaziland, Peter M, MD  OVER THE COUNTER MEDICATION Heart Health Multivitamin (name?): Take 1 tablet by mouth three times a day    [provider]  rosuvastatin (CRESTOR) 5 MG tablet TAKE 1 TABLET BY MOUTH 3 TIMES PER WEEK Patient taking differently: Take 5 mg by mouth once a day on Mon/Wed/Fri 08/14/17   Swaziland, Peter M, MD  Saw Palmetto, Serenoa repens, (SAW PALMETTO PO) Take 1 tablet by mouth 3 (three) times daily.     [provider]  spironolactone (ALDACTONE) 25 MG tablet Take 1 tablet (25 mg total) by mouth daily. 07/01/14   Swaziland, Peter M, MD  tamsulosin (  FLOMAX) 0.4 MG CAPS capsule Take 0.4 mg by mouth daily as needed (for urinary symptoms).     [provider]  warfarin (COUMADIN) 3 MG tablet Take 1.5-3 mg by mouth See admin instructions. 3 mg in the morning on Sun/Mon/Wed/Thurs/Sat and 1.5 mg on Tues/Fri    [provider]    Family History Family History  Problem Relation Age of Onset  . CAD Father     Social History Social History   Tobacco Use  . Smoking status: Former Games developer  . Smokeless tobacco: Never Used  Substance Use Topics  . Alcohol use: No    Alcohol/week: 0.0 oz  . Drug use: No     Allergies   Bee venom   Review of  Systems Review of Systems ROS reviewed and all are negative for acute change except as noted in the HPI.  Physical Exam Updated Vital Signs BP 92/80   Pulse (!) 155   Temp (!) 97.5 F (36.4 C) (Oral)   Resp (!) 23   SpO2 98%   Physical Exam  Constitutional: He is oriented to person, place, and time. He appears well-developed and well-nourished. No distress.  HENT:  Head: Normocephalic and atraumatic.  Right Ear: Tympanic membrane, external ear and ear canal normal.  Left Ear: Tympanic membrane, external ear and ear canal normal.  Nose: Nose normal.  Mouth/Throat: Uvula is midline, oropharynx is clear and moist and mucous membranes are normal. No trismus in the jaw. No oropharyngeal exudate, posterior oropharyngeal erythema or tonsillar abscesses.  Eyes: EOM are normal. Pupils are equal, round, and reactive to light.  Neck: Normal range of motion. Neck supple. No tracheal deviation present.  Cardiovascular: Regular rhythm, S1 normal, S2 normal, normal heart sounds, intact distal pulses and normal pulses. Tachycardia present.  Pulmonary/Chest: Effort normal and breath sounds normal. No respiratory distress. He has no decreased breath sounds. He has no wheezes. He has no rhonchi. He has no rales.  Abdominal: Normal appearance and bowel sounds are normal. There is no tenderness.  Musculoskeletal: Normal range of motion.  Neurological: He is alert and oriented to person, place, and time.  Skin: Skin is warm and dry.  Psychiatric: He has a normal mood and affect. His speech is normal and behavior is normal. Thought content normal.  Nursing note and vitals reviewed.  ED Treatments / Results  Labs (all labs ordered are listed, but only abnormal results are displayed) Labs Reviewed  BASIC METABOLIC PANEL - Abnormal; Notable for the following components:      Result Value   CO2 21 (*)    Glucose, Bld 116 (*)    BUN 32 (*)    Creatinine, Ser 1.63 (*)    GFR calc non Af Amer 37 (*)     GFR calc Af Amer 43 (*)    All other components within normal limits  PROTIME-INR - Abnormal; Notable for the following components:   Prothrombin Time 28.9 (*)    All other components within normal limits  CBC    EKG  EKG Interpretation None       Radiology No results found.  Procedures .Cardioversion Date/Time: 10/23/2017 7:34 PM Performed by: Audry Pili, PA-C Authorized by: Audry Pili, PA-C   Consent:    Consent obtained:  Verbal   Consent given by:  Patient   Risks discussed:  Cutaneous burn, death, induced arrhythmia and pain   Alternatives discussed:  Rate-control medication Pre-procedure details:    Cardioversion basis:  Emergent  Rhythm:  Atrial flutter   Electrode placement:  Anterior-posterior Patient sedated: Yes. Refer to sedation procedure documentation for details of sedation.  Attempt one:    Cardioversion mode:  Synchronous   Waveform:  Biphasic   Shock (Joules):  200   Shock outcome:  Conversion to normal sinus rhythm Post-procedure details:    Patient status:  Awake   Patient tolerance of procedure:  Tolerated well, no immediate complications Comments:     NSR. CN evaluation unremarkable. No neuro deficits post cardioversion.      (including critical care time) CRITICAL CARE Performed by: Eston Esters   Total critical care time: 40 minutes  Critical care time was exclusive of separately billable procedures and treating other patients.  Critical care was necessary to treat or prevent imminent or life-threatening deterioration.  Critical care was time spent personally by me on the following activities: development of treatment plan with patient and/or surrogate as well as nursing, discussions with consultants, evaluation of patient's response to treatment, examination of patient, obtaining history from patient or surrogate, ordering and performing treatments and interventions, ordering and review of laboratory studies, ordering and review  of radiographic studies, pulse oximetry and re-evaluation of patient's condition   Medications Ordered in ED Medications  etomidate (AMIDATE) injection 10 mg (not administered)  adenosine (ADENOCARD) 6 MG/2ML injection 6 mg (6 mg Intravenous Given 10/23/17 1848)  adenosine (ADENOCARD) 6 MG/2ML injection 12 mg (12 mg Intravenous Given 10/23/17 1852)   Initial Impression / Assessment and Plan / ED Course  I have reviewed the triage vital signs and the nursing notes.  Pertinent labs & imaging results that were available during my care of the patient were reviewed by me and considered in my medical decision making (see chart for details).  Final Clinical Impressions(s) / ED Diagnoses  {I have reviewed and evaluated the relevant laboratory values. {I have reviewed and evaluated the relevant imaging studies. {I have interpreted the relevant EKG. {I have reviewed the relevant previous healthcare records.  {I obtained HPI from historian. {Patient discussed with supervising physician.  ED Course:  Assessment: Pt is a 83 y.o. male with a history of severe AS (s/p redo AVR with mechanical prosthesis in 1996, on Coumadin), CAD (s/p PCI in 1996), chronic systolic CHF (EF 92-01% by echo in 02/2016), chronic LBBB, HTN, and HLD, presents to the Emergency Department today due to palpitations. Notes earlier today around 1500 when he saw his elevated HR on his apple watch. States that he was shoveling snow at that time. Denies CP/SOB. No N/V. No diaphoresis. No numbness/tingling. No cough/congestion. No fevers. Pt asymptomatic currently. Seen in ED on 09-06-17 for same. X2 adenosine with minimal effect. Pt was cardioverted with 200J under propofol with conversion to NSR.  He has a past history of bradycardia with beta blockers. Due to low EF the only appropriate antiarrhythmic medications with the dofetilide or amiodarone. On exam, pt in NAD. Nontoxic/nonseptic appearing. VS with tachcyardia. Hypotensive with BP  90s. Afebrile. Lungs CTA. Abdomen nontender soft. EKG shows wide complex tachycardia with LBBB. Similar to previous. Attempted 76mm and then 12 mg adenosine without effect. Consult to Cardiology agreed to Cardioversion. Labs unremarkable. Cardioverted with conversion to NSR. Pt baseline mental status. No neuro deficits. Observed in ED without reoccurrence. Plan is to DC home with follow up to Cardiology. At time of discharge, Patient is in no acute distress. Vital Signs are stable. Patient is able to ambulate. Patient able to tolerate PO.   Disposition/Plan:  Dc Home Additional Verbal discharge instructions given and discussed with patient.  Pt Instructed to f/u with Cards in the next week for evaluation and treatment of symptoms. Return precautions given Pt acknowledges and agrees with plan  Supervising Physician Wynetta FinesMessick, Peter C, MD  Final diagnoses:  Atrial flutter with rapid ventricular response [facility](HCC)    ED Discharge Orders    None       Audry PiliMohr, Thurston Brendlinger, Cordelia Poche-C 10/23/17 2013    Wynetta FinesMessick, Peter C, MD 10/23/17 2328

## 2017-10-23 NOTE — ED Provider Notes (Addendum)
Patient seen in conjunction with the mid-level provider.  Patient required emergent cardioversion for A. Flutter with RVR.  Patient's history was a contraindication to use of antiarrhythmic medications.  No improvement of rate with use of Adenosine. Patient with prior episodes of rapid Afib that were successfully treated with cardioversion. Please see mid-level provider's note for additional history and exam findings.   Physical Exam  BP (!) 111/55   Pulse 67   Temp (!) 97.5 F (36.4 C) (Oral)   Resp 19   SpO2 95%   Physical Exam  Awake, alert Comfortable No respiratory distress Heart rate into the 150s, Rapid Aflutter Normal distal perfusion   ED Course/Procedures     .Cardioversion Date/Time: 10/23/2017 7:52 PM Performed by: Wynetta FinesMessick, Peter C, MD Authorized by: Wynetta FinesMessick, Peter C, MD   Consent:    Consent obtained:  Verbal and emergent situation   Consent given by:  Patient and spouse   Risks discussed:  Cutaneous burn, death, induced arrhythmia and pain   Alternatives discussed:  Rate-control medication and no treatment Pre-procedure details:    Cardioversion basis:  Emergent   Rhythm:  Atrial fibrillation   Electrode placement:  Anterior-posterior Patient sedated: Yes. Refer to sedation procedure documentation for details of sedation.  Attempt one:    Cardioversion mode:  Synchronous   Shock (Joules):  200   Shock outcome:  Conversion to normal sinus rhythm Post-procedure details:    Patient status:  Awake   Patient tolerance of procedure:  Tolerated well, no immediate complications .Sedation Date/Time: 10/23/2017 7:53 PM Performed by: Wynetta FinesMessick, Peter C, MD Authorized by: Wynetta FinesMessick, Peter C, MD   Consent:    Consent obtained:  Verbal   Consent given by:  Patient   Risks discussed:  Allergic reaction, dysrhythmia, inadequate sedation, nausea, prolonged hypoxia resulting in organ damage, prolonged sedation necessitating reversal, respiratory compromise necessitating  ventilatory assistance and intubation and vomiting   Alternatives discussed:  Analgesia without sedation, anxiolysis and regional anesthesia Universal protocol:    Procedure explained and questions answered to patient or proxy's satisfaction: yes     Relevant documents present and verified: yes     Test results available and properly labeled: yes     Imaging studies available: yes     Required blood products, implants, devices, and special equipment available: yes     Site/side marked: yes     Immediately prior to procedure a time out was called: yes     Patient identity confirmation method:  Verbally with patient Indications:    Procedure necessitating sedation performed by:  Physician performing sedation   Intended level of sedation:  Deep Pre-sedation assessment:    Time since last food or drink:  5 hours   ASA classification: class 3 - patient with severe systemic disease     Neck mobility: normal     Mouth opening:  3 or more finger widths   Thyromental distance:  4 finger widths   Mallampati score:  I - soft palate, uvula, fauces, pillars visible   Pre-sedation assessments completed and reviewed: airway patency, cardiovascular function, hydration status, mental status, nausea/vomiting, pain level, respiratory function and temperature   Immediate pre-procedure details:    Reassessment: Patient reassessed immediately prior to procedure     Reviewed: vital signs, relevant labs/tests and NPO status     Verified: bag valve mask available, emergency equipment available, intubation equipment available, IV patency confirmed, oxygen available and suction available   Procedure details (see MAR for exact dosages):  Preoxygenation:  Nasal cannula   Sedation:  Propofol (30 mg propofol )   Intra-procedure monitoring:  Blood pressure monitoring, cardiac monitor, continuous pulse oximetry, frequent LOC assessments, frequent vital sign checks and continuous capnometry   Intra-procedure events:  none     Total Provider sedation time (minutes):  30 Post-procedure details:    Attendance: Constant attendance by certified staff until patient recovered     Recovery: Patient returned to pre-procedure baseline     Post-sedation assessments completed and reviewed: airway patency, cardiovascular function, hydration status, mental status, nausea/vomiting, pain level, respiratory function and temperature     Patient is stable for discharge or admission: yes     Patient tolerance:  Tolerated well, no immediate complications          Wynetta Fines, MD 10/23/17 1958    Wynetta Fines, MD 10/23/17 2013    Wynetta Fines, MD 10/23/17 2014

## 2017-10-23 NOTE — Discharge Instructions (Addendum)
Please read and follow all provided instructions.  Your diagnoses today include:  1. Atrial Flutter  Tests performed today include: Vital signs. See below for your results today.   Medications prescribed:  Take as prescribed   Home care instructions:  Follow any educational materials contained in this packet.  Follow-up instructions: Please follow-up with your Cardiology for further evaluation of symptoms and treatment   Return instructions:  Please return to the Emergency Department if you do not get better, if you get worse, or new symptoms OR  - Fever (temperature greater than 101.58F)  - Bleeding that does not stop with holding pressure to the area    -Severe pain (please note that you may be more sore the day after your accident)  - Chest Pain  - Difficulty breathing  - Severe nausea or vomiting  - Inability to tolerate food and liquids  - Passing out  - Skin becoming red around your wounds  - Change in mental status (confusion or lethargy)  - New numbness or weakness    Please return if you have any other emergent concerns.  Additional Information:  Your vital signs today were: BP 123/81    Pulse (!) 156    Temp (!) 97.5 F (36.4 C) (Oral)    Resp (!) 25    SpO2 98%  If your blood pressure (BP) was elevated above 135/85 this visit, please have this repeated by your doctor within one month. ---------------

## 2017-10-23 NOTE — ED Triage Notes (Addendum)
Pt has hx of a flutter, states today he noticed on his apple watch that his heart rate was going up and down after shoveling some snow. Pt denies cp/sob. Nad. HR 155 in triage.

## 2017-10-23 NOTE — ED Notes (Signed)
Consent for cardioversion E signed.

## 2017-10-23 NOTE — ED Notes (Signed)
ED Provider at bedside. 

## 2017-10-24 ENCOUNTER — Telehealth (HOSPITAL_COMMUNITY): Payer: Self-pay | Admitting: *Deleted

## 2017-10-24 NOTE — Telephone Encounter (Signed)
Pt recvd dccv in ED for afib.  I cld to offer follow up appt with our clinic or suggest patient contact Dr. Illa Level office for sooner appt since not scheduled until Feb.  Pts spouse stated she would contact [facility] office first.  No further action needed.

## 2017-10-25 ENCOUNTER — Telehealth: Payer: Self-pay | Admitting: Cardiology

## 2017-10-25 NOTE — Telephone Encounter (Signed)
Returned the call to patient's wife, per the dpr. She stated that the patient recently had a cardioversion. She wanted to set up an appointment for tomorrow with Dr. Swaziland. She has been informed that he is not in the office tomorrow. An appointment has been made with Theodore Demark, PA for tomorrow. She verbalized her understanding.

## 2017-10-25 NOTE — Telephone Encounter (Signed)
New Message  Pt wife call requesting to speak with RN about getting pt an appt for tomorrow. Pt wife states pt had to have his heart shock back into rhythm again. Please call back to discuss

## 2017-10-26 ENCOUNTER — Ambulatory Visit (INDEPENDENT_AMBULATORY_CARE_PROVIDER_SITE_OTHER): Payer: Medicare Other | Admitting: Physician Assistant

## 2017-10-26 ENCOUNTER — Encounter: Payer: Self-pay | Admitting: Physician Assistant

## 2017-10-26 VITALS — BP 129/66 | HR 57 | Ht 67.0 in | Wt 145.2 lb

## 2017-10-26 DIAGNOSIS — Z7901 Long term (current) use of anticoagulants: Secondary | ICD-10-CM | POA: Diagnosis not present

## 2017-10-26 DIAGNOSIS — I4892 Unspecified atrial flutter: Secondary | ICD-10-CM | POA: Diagnosis not present

## 2017-10-26 DIAGNOSIS — I420 Dilated cardiomyopathy: Secondary | ICD-10-CM | POA: Diagnosis not present

## 2017-10-26 DIAGNOSIS — I5042 Chronic combined systolic (congestive) and diastolic (congestive) heart failure: Secondary | ICD-10-CM | POA: Diagnosis not present

## 2017-10-26 DIAGNOSIS — R001 Bradycardia, unspecified: Secondary | ICD-10-CM

## 2017-10-26 DIAGNOSIS — I2510 Atherosclerotic heart disease of native coronary artery without angina pectoris: Secondary | ICD-10-CM

## 2017-10-26 NOTE — Progress Notes (Signed)
Cardiology Office Note   Date:  10/26/2017   ID:  Christopher Mann, DOB 1930/04/15, MRN [id number]  PCP:  Vivien Presto, MD  Cardiologist: Dr. Swaziland, 09/06/2017 Randall An PA-C, 09/18/2017 Theodore Demark, PA-C    History of Present Illness: Christopher Mann is a 83 y.o. male with a history of severe AS (s/p redo AVR with mechanical prosthesis in 1996, on Coumadin), CAD (s/p PCI in 1996), chronic systolic CHF (EF 04-59% by echo in 02/2016), chronic LBBB, HTN, and HLD, CKD III  10/23/2017 ER visit, patient in atrial flutter with rapid ventricular response, no change in rate with adenosine.  Patient successfully cardioverted to sinus rhythm  Christopher Mann presents for cardiology follow up.  He bowls 2 x week. He is active, out every day. Works as a [employer] manager. He was out moving snow off the car when he went out of rhythm last week.  He has not been shoveling snow.  However, his last 2 episodes of atrial fibrillation have occurred in the setting of some exertion.  PCP follows his coumadin, INR was 2.8 this week.  He denies SOB, but has some DOE. No LE edema, no orthopnea or PND. At one point, he took Lasix and that helped his DOE. Does not weigh daily, weighs when he goes to the drugstore, etc.   Does not get CP w/ exertion.   His weight is up today, the patient feels it is from heavier clothes and shoes.   He has an Apple watch, his HR drops into the 40s at times. He does not recall any particular sx from the low HR.    Past Medical History:  Diagnosis Date  . Aortic stenosis    a. s/p redo aortic valve replacement in 1996 with mechanical prosthesis. On Coumadin  . Atrial flutter (HCC)    a. s/p DCCV in 08/2017  . Bundle branch block, left   . Chronic combined systolic and diastolic CHF (congestive heart failure) (HCC)    a. 2012: Echo showing EF of 25-30% b. 02/2016: Echo with EF of 20-25%, Grade 2 DD, mechanical AV functioning normally, PA Pressure 37 mm Hg  .  Coronary artery disease 05/1995   a. s/p PCI with stent placement in 1996.  . Dilated cardiomyopathy (HCC)   . Elevated fasting blood sugar   . Hypercholesterolemia   . Hypertension     Past Surgical History:  Procedure Laterality Date  . AORTIC VALVE REPLACEMENT  1996    Current Outpatient Medications  Medication Sig Dispense Refill  . acetaminophen (TYLENOL) 325 MG tablet Take 325 mg by mouth every 6 (six) hours as needed (for pain or headaches).     . furosemide (LASIX) 20 MG tablet Take 20 mg as needed by mouth for fluid or edema.    Marland Kitchen losartan (COZAAR) 100 MG tablet Take 1 tablet (100 mg total) by mouth daily. 90 tablet 3  . rosuvastatin (CRESTOR) 5 MG tablet TAKE 1 TABLET BY MOUTH 3 TIMES PER WEEK (Patient taking differently: Take 5 mg by mouth once a day on Mon/Wed/Fri) 36 tablet 4  . Saw Palmetto, Serenoa repens, (SAW PALMETTO PO) Take 1 tablet by mouth daily.     . sildenafil (REVATIO) 20 MG tablet Take 20 mg by mouth as needed.    Marland Kitchen spironolactone (ALDACTONE) 25 MG tablet Take 1 tablet (25 mg total) by mouth daily. 90 tablet 3  . tamsulosin (FLOMAX) 0.4 MG CAPS capsule Take 0.4 mg by mouth daily as needed (for  urinary symptoms).     . warfarin (COUMADIN) 3 MG tablet Take 1.5-3 mg by mouth See admin instructions. 3 mg in the morning on Sun/Mon/Wed/Thurs/Sat and 1.5 mg on Tues/Fri     No current facility-administered medications for this visit.     Allergies:   Bee venom    Social History:  The patient  reports that he has quit smoking. he has never used smokeless tobacco. He reports that he does not drink alcohol or use drugs.   Family History:  The patient's family history includes CAD in his father.    ROS:  Please see the history of present illness. All other systems are reviewed and negative.    PHYSICAL EXAM: VS:  BP 129/66   Pulse (!) 57   Ht 5\' 7"  (1.702 m)   Wt 145 lb 3.2 oz (65.9 kg)   BMI 22.74 kg/m  , BMI Body mass index is 22.74 kg/m. GEN: Well  nourished, well developed, male in no acute distress  HEENT: normal for age  Neck: JVD 8-9 cm, no carotid bruit, no masses Cardiac: RRR; soft murmur, crisp valve click, no rubs, or gallops Respiratory:  clear to auscultation bilaterally, normal work of breathing GI: soft, nontender, nondistended, + BS MS: no deformity or atrophy; no edema; distal pulses are 2+ in all 4 extremities   Skin: warm and dry, no rash Neuro:  Strength and sensation are intact Psych: euthymic mood, full affect   EKG:  EKG is ordered today. ECG 10/23/2017 17:44 is wide-complex tachycardia, heart rate 157, left bundle branch block, atrial fib versus atrial tachycardia The ekg ordered today demonstrates Sinus bradycardia, HR 57, LBBB is old.   ECHO: 09/13/2017 - Left ventricle: The cavity size was moderately dilated. Wall   thickness was increased in a pattern of moderate LVH. Systolic   function was severely reduced. The estimated ejection fraction   was in the range of 20% to 25%. Features are consistent with a   pseudonormal left ventricular filling pattern, with concomitant   abnormal relaxation and increased filling pressure (grade 2   diastolic dysfunction). - Aortic valve: A mechanical prosthesis was present. There was   trivial regurgitation. - Mitral valve: There was mild regurgitation. - Left atrium: The atrium was severely dilated. - Right atrium: The atrium was mildly dilated. - Pulmonary arteries: Systolic pressure was mildly increased. PA   peak pressure: 36 mm Hg (S).   Recent Labs: 09/06/2017: ALT 59 09/12/2017: BNP 1,240.2 10/23/2017: BUN 32; Creatinine, Ser 1.63; Hemoglobin 13.0; Platelets 214; Potassium 4.7; Sodium 140    Lipid Panel    Component Value Date/Time   CHOL 168 09/18/2017 0859   TRIG 79 09/18/2017 0859   HDL 38 (L) 09/18/2017 0859   CHOLHDL 4.4 09/18/2017 0859   CHOLHDL 5.5 (H) 10/24/2016 0941   VLDL 28 10/24/2016 0941   LDLCALC 114 (H) 09/18/2017 0859   LDLDIRECT  109.7 12/11/2013 1045     Wt Readings from Last 3 Encounters:  10/26/17 145 lb 3.2 oz (65.9 kg)  09/18/17 139 lb (63 kg)  09/12/17 146 lb (66.2 kg)     Other studies Reviewed: Additional studies/ records that were reviewed today include: Office notes, hospital records and testing.  ASSESSMENT AND PLAN:  1.  Paroxysmal atrial fibrillation/flutter with rapid ventricular response: I explained that he has multiple reasons to have atrial fibrillation.  He is over the age of 59, has a dilated cardiomyopathy, has had valve surgery, and has a remote history of  tobacco use.  I discussed the possibility of adding an antiarrhythmic, and explained that because of his CHF and CAD, many of them could not be used.  Amiodarone is an option, but I would prefer his case be reviewed by 1 of the EP doctors and the decision made by them. I mentioned that AV node ablation and a pacemaker can be used as a last resort, so we are not out of options.  As he is in sinus rhythm today, nothing has to be done right now.  He understands that because his heart goes so fast when it is out of rhythm, he will have to go to the emergency room if it happens again.  I explained that we could not start a beta-blocker or calcium channel blocker because his resting heart rate drops into the 40s at times.  2.  Chronic anticoagulation: He is careful to take his Coumadin daily and follow-up with his PCP for Coumadin checks.  He denies any history of recent subtherapeutic INRs.  3.  Chronic systolic CHF: He is on a low dose of Lasix that he is currently taking as needed only.  He is taking Cozaar 100 mg a day.  No beta-blocker because of low heart rate.  He is also on Aldactone daily.  He has mild volume overload by exam.  I will ask him to take his Lasix 20 mg tablets scheduled 2 times a week.  I think this might be all he needs to manage his volume.  In general, he does very well with dietary and fluid restrictions.  Current  medicines are reviewed at length with the patient today.  The patient does not have concerns regarding medicines.  The following changes have been made:    Labs/ tests ordered today include:   Orders Placed This Encounter  Procedures  . EKG 12-Lead     Disposition:   FU with Dr. SwazilandJordan  Signed, Theodore Demarkhonda Barrett, PA-C  10/26/2017 4:50 PM    [facility] Phone: [phone]; Fax: [phone]  This note was written with the assistance of speech recognition software. Please excuse any transcriptional errors.

## 2017-10-26 NOTE — Patient Instructions (Signed)
Medication Instructions:  TAKE YOUR LASIX ON Monday AND Thursday ALSO TAKE  AS NEEDED FOR WEIGHT GAIN >3 POUNDS IN ONE WEEK OR >5 POUNDS IN ONE WEEK  If you need a refill on your cardiac medications before your next appointment, please call your pharmacy.  Special Instructions: REFER TO EP DOCTOR-NOT APPROPRIATE FOR AFIB CLINIC  Follow-Up: Your physician wants you to follow-up in: AS SCHEDULED WITH DR Swaziland.   Thank you for choosing [facility] at [facility]!!

## 2017-11-21 ENCOUNTER — Institutional Professional Consult (permissible substitution): Payer: Medicare Other | Admitting: Internal Medicine

## 2017-11-26 ENCOUNTER — Encounter: Payer: Self-pay | Admitting: Internal Medicine

## 2017-11-26 ENCOUNTER — Ambulatory Visit (INDEPENDENT_AMBULATORY_CARE_PROVIDER_SITE_OTHER): Payer: Medicare Other | Admitting: Internal Medicine

## 2017-11-26 VITALS — BP 122/66 | HR 58 | Ht 67.0 in | Wt 139.0 lb

## 2017-11-26 DIAGNOSIS — I420 Dilated cardiomyopathy: Secondary | ICD-10-CM | POA: Diagnosis not present

## 2017-11-26 DIAGNOSIS — I447 Left bundle-branch block, unspecified: Secondary | ICD-10-CM

## 2017-11-26 NOTE — Progress Notes (Signed)
HPI Christopher Mann is referred today by Dr. Swaziland for evaluation of atrial flutter as well as chronic systolic heart failure, class 1, EF 25%, LBBB, and sinus bradycardia. He is a remarkable man. He underwent AVR in 1986 and again in 1996. He has been on warfarin since then. He was seen in December with a wide qrs tachy at 160/min. He was minimally symptomatic. He was treated with IV adenosine and then DCCV. He did not feel palpitations. He has been stable in the interim. He still works daily in a [employer] He has not had syncope. He deneis peripheral edema. He is on good medical therapy with an ARB and a diuretic but cannot take a beta blocker due to bradycardia. His LBBB QRS duration is 182 ms. Allergies  Allergen Reactions  . Bee Venom Anaphylaxis     Current Outpatient Medications  Medication Sig Dispense Refill  . acetaminophen (TYLENOL) 325 MG tablet Take 325 mg by mouth every 6 (six) hours as needed (for pain or headaches).     . furosemide (LASIX) 20 MG tablet Take 20 mg as needed by mouth for fluid or edema.    Marland Kitchen losartan (COZAAR) 100 MG tablet Take 1 tablet (100 mg total) by mouth daily. 90 tablet 3  . rosuvastatin (CRESTOR) 5 MG tablet TAKE 1 TABLET BY MOUTH 3 TIMES PER WEEK (Patient taking differently: Take 5 mg by mouth once a day on Mon/Wed/Fri) 36 tablet 4  . Saw Palmetto, Serenoa repens, (SAW PALMETTO PO) Take 1 tablet by mouth daily.     . sildenafil (REVATIO) 20 MG tablet Take 20 mg by mouth as needed.    Marland Kitchen spironolactone (ALDACTONE) 25 MG tablet Take 1 tablet (25 mg total) by mouth daily. 90 tablet 3  . tamsulosin (FLOMAX) 0.4 MG CAPS capsule Take 0.4 mg by mouth daily as needed (for urinary symptoms).     . warfarin (COUMADIN) 3 MG tablet Take 1.5-3 mg by mouth See admin instructions. 3 mg in the morning on Sun/Mon/Wed/Thurs/Sat and 1.5 mg on Tues/Fri     No current facility-administered medications for this visit.      Past Medical History:  Diagnosis Date  .  Aortic stenosis    a. s/p redo aortic valve replacement in 1996 with mechanical prosthesis. On Coumadin  . Atrial flutter (HCC)    a. s/p DCCV in 08/2017  . Bundle branch block, left   . Chronic combined systolic and diastolic CHF (congestive heart failure) (HCC)    a. 2012: Echo showing EF of 25-30% b. 02/2016: Echo with EF of 20-25%, Grade 2 DD, mechanical AV functioning normally, PA Pressure 37 mm Hg  . Coronary artery disease 05/1995   a. s/p PCI with stent placement in 1996.  . Dilated cardiomyopathy (HCC)   . Elevated fasting blood sugar   . Hypercholesterolemia   . Hypertension     ROS:   All systems reviewed and negative except as noted in the HPI.   Past Surgical History:  Procedure Laterality Date  . AORTIC VALVE REPLACEMENT  1996     Family History  Problem Relation Age of Onset  . CAD Father      Social History   Socioeconomic History  . Marital status: Married    Spouse name: Not on file  . Number of children: Not on file  . Years of education: Not on file  . Highest education level: Not on file  Social Needs  . Physicist, medical  strain: Not on file  . Food insecurity - worry: Not on file  . Food insecurity - inability: Not on file  . Transportation needs - medical: Not on file  . Transportation needs - non-medical: Not on file  Occupational History  . Not on file  Tobacco Use  . Smoking status: Former Games developer  . Smokeless tobacco: Never Used  Substance and Sexual Activity  . Alcohol use: No    Alcohol/week: 0.0 oz  . Drug use: No  . Sexual activity: Not on file  Other Topics Concern  . Not on file  Social History Narrative  . Not on file     BP 122/66   Pulse (!) 58   Ht 5\' 7"  (1.702 m)   Wt 139 lb (63 kg)   BMI 21.77 kg/m   Physical Exam:  Well appearing 84 yo man, NAD HEENT: Unremarkable Neck:  5 cm JVD, no thyromegally Lymphatics:  No adenopathy Back:  No CVA tenderness Lungs:  Clear with no wheezes HEART:  Regular rate  rhythm, no murmurs, no rubs, no clicks, mechanical S2 Abd:  soft, positive bowel sounds, no organomegally, no rebound, no guarding Ext:  2 plus pulses, no edema, no cyanosis, no clubbing Skin:  No rashes no nodules Neuro:  CN II through XII intact, motor grossly intact  EKG sinus bradycardia with LBBB  Assess/Plan: 1. Atrial flutter - he will continue his systemic anti-coagulation. He is not on any medical therapy due to his propensity for bradycardia. 2. Chronic systolic heart failure - he has class 1 symptoms. I have discussed the indications for BiV ICD or PM insertion (EF 20%, LBBB, QRS 180) and he will consider his options. He is not sure that with his lack of symptoms he wishes to proceed. 3. Sinus bradycardia - he has sinus node dysfunction as well as significant conduction system disease and will not be able to take a beta blocker.  Leonia Reeves.D.

## 2017-11-26 NOTE — Patient Instructions (Addendum)
Medication Instructions:  Your physician recommends that you continue on your current medications as directed. Please refer to the Current Medication list given to you today.  Labwork: None ordered.  Testing/Procedures: None ordered.   Follow-Up: Your physician wants you to follow-up in: 3 months with Dr Ladona Ridgel.   Any Other Special Instructions Will Be Listed Below (If Applicable).  If you decide you'd like to schedule a procedure for an ICD or Pacemaker implant before your follow, please call the office at [phone]. Ask for Boneta Lucks, RN.      If you need a refill on your cardiac medications before your next appointment, please call your pharmacy.

## 2017-11-29 ENCOUNTER — Telehealth: Payer: Self-pay | Admitting: Internal Medicine

## 2017-11-29 DIAGNOSIS — I5022 Chronic systolic (congestive) heart failure: Secondary | ICD-10-CM

## 2017-11-29 DIAGNOSIS — I447 Left bundle-branch block, unspecified: Secondary | ICD-10-CM

## 2017-11-29 NOTE — Telephone Encounter (Signed)
New message   Patient spouse is calling to discuss pacemaker/ defib procedure. Please call

## 2017-11-29 NOTE — Telephone Encounter (Signed)
Returned call to Pt wife.  Family thinks they would like to go ahead with surgery.  Discussed pacemaker vs. ICD-pacemaker helps heart beat, icd helps heart beat and can provide shock if needed.  Explained only one device inserted.  Wife indicates understanding.  Gave some available upcoming dates.  Wife will call back today and notify this nurse what day they would like.  Await call back.

## 2017-12-04 NOTE — Telephone Encounter (Signed)
Follow up     Patient calling to request a Wednesday procedure. Please call

## 2017-12-05 NOTE — Telephone Encounter (Signed)
Follow Up:; ° ° °Returning your call. °

## 2017-12-05 NOTE — Telephone Encounter (Signed)
New message ° °Pt wife verbalized that she is calling for the RN °

## 2017-12-05 NOTE — Telephone Encounter (Signed)
Returned call to Pt.  Will schedule Pt for BIV ICD on January 31, 7:30 am.  Pt will come in for labs, instructions and soap on December 11, 2017 when this nurse is in the office for direction.

## 2017-12-06 NOTE — Telephone Encounter (Signed)
Spoke with Pt wife.  Requested they come in for labs on 12/11/2017 early in the day so we can draw a stat PT/INR.  Will attempt to hold warfarin for one day prior to procedure based on results of labs.  Will meet with family on lab day.

## 2017-12-11 ENCOUNTER — Other Ambulatory Visit: Payer: Medicare Other | Admitting: *Deleted

## 2017-12-11 DIAGNOSIS — I447 Left bundle-branch block, unspecified: Secondary | ICD-10-CM

## 2017-12-11 DIAGNOSIS — I5022 Chronic systolic (congestive) heart failure: Secondary | ICD-10-CM

## 2017-12-11 LAB — BASIC METABOLIC PANEL
BUN/Creatinine Ratio: 22 (ref 10–24)
BUN: 35 mg/dL — AB (ref 8–27)
CALCIUM: 9.9 mg/dL (ref 8.6–10.2)
CO2: 22 mmol/L (ref 20–29)
CREATININE: 1.58 mg/dL — AB (ref 0.76–1.27)
Chloride: 112 mmol/L — ABNORMAL HIGH (ref 96–106)
GFR calc Af Amer: 45 mL/min/{1.73_m2} — ABNORMAL LOW (ref >59)
GFR, EST NON AFRICAN AMERICAN: 39 mL/min/{1.73_m2} — AB (ref >59)
Glucose: 85 mg/dL (ref 65–99)
POTASSIUM: 4.6 mmol/L (ref 3.5–5.2)
Sodium: 140 mmol/L (ref 134–144)

## 2017-12-11 LAB — CBC WITH DIFFERENTIAL/PLATELET
BASOS: 0 %
Basophils Absolute: 0 10*3/uL (ref 0.0–0.2)
EOS (ABSOLUTE): 0.1 10*3/uL (ref 0.0–0.4)
Eos: 2 %
HEMATOCRIT: 36.9 % — AB (ref 37.5–51.0)
HEMOGLOBIN: 12.6 g/dL — AB (ref 13.0–17.7)
LYMPHS: 16 %
Lymphocytes Absolute: 0.8 10*3/uL (ref 0.7–3.1)
MCH: 29.9 pg (ref 26.6–33.0)
MCHC: 34.1 g/dL (ref 31.5–35.7)
MCV: 87 fL (ref 79–97)
Monocytes Absolute: 0.6 10*3/uL (ref 0.1–0.9)
Monocytes: 12 %
NEUTROS PCT: 70 %
Neutrophils Absolute: 3.6 10*3/uL (ref 1.4–7.0)
Platelets: 196 10*3/uL (ref 150–379)
RBC: 4.22 x10E6/uL (ref 4.14–5.80)
RDW: 14.7 % (ref 12.3–15.4)
WBC: 5.1 10*3/uL (ref 3.4–10.8)

## 2017-12-11 LAB — PROTIME-INR
INR: 2.8 — ABNORMAL HIGH (ref 0.8–1.2)
PROTHROMBIN TIME: 29.9 s — AB (ref 9.1–12.0)

## 2017-12-11 NOTE — Telephone Encounter (Signed)
Results of PT/INR received. INR 2.8.  Notified Dr. Ladona Ridgel.  Per Dr. Ladona Ridgel, have Pt hold coumadin tomorrow 12/13/3027 and am of procedure 12/13/2017.  Notified Pt wife.  Wife indicates understanding.  All questions answered.

## 2017-12-12 ENCOUNTER — Telehealth: Payer: Self-pay

## 2017-12-12 NOTE — Telephone Encounter (Signed)
Lab results left on VM.

## 2017-12-13 ENCOUNTER — Other Ambulatory Visit: Payer: Self-pay

## 2017-12-13 ENCOUNTER — Ambulatory Visit (HOSPITAL_COMMUNITY)
Admission: RE | Admit: 2017-12-13 | Discharge: 2017-12-14 | Disposition: A | Discharge status: Home / Self Care | Payer: Medicare Other | Source: Ambulatory Visit | Attending: Internal Medicine | Admitting: Internal Medicine

## 2017-12-13 ENCOUNTER — Encounter (HOSPITAL_COMMUNITY): Payer: Self-pay | Admitting: Internal Medicine

## 2017-12-13 ENCOUNTER — Ambulatory Visit (HOSPITAL_COMMUNITY)
Admission: RE | Disposition: A | Discharge status: Home / Self Care | Payer: Self-pay | Source: Ambulatory Visit | Attending: Internal Medicine

## 2017-12-13 DIAGNOSIS — Z952 Presence of prosthetic heart valve: Secondary | ICD-10-CM | POA: Diagnosis not present

## 2017-12-13 DIAGNOSIS — Z95810 Presence of automatic (implantable) cardiac defibrillator: Secondary | ICD-10-CM | POA: Diagnosis present

## 2017-12-13 DIAGNOSIS — I110 Hypertensive heart disease with heart failure: Secondary | ICD-10-CM | POA: Insufficient documentation

## 2017-12-13 DIAGNOSIS — I447 Left bundle-branch block, unspecified: Secondary | ICD-10-CM | POA: Diagnosis present

## 2017-12-13 DIAGNOSIS — Z87891 Personal history of nicotine dependence: Secondary | ICD-10-CM | POA: Insufficient documentation

## 2017-12-13 DIAGNOSIS — Z7901 Long term (current) use of anticoagulants: Secondary | ICD-10-CM | POA: Insufficient documentation

## 2017-12-13 DIAGNOSIS — Z006 Encounter for examination for normal comparison and control in clinical research program: Secondary | ICD-10-CM | POA: Insufficient documentation

## 2017-12-13 DIAGNOSIS — I5022 Chronic systolic (congestive) heart failure: Secondary | ICD-10-CM | POA: Diagnosis not present

## 2017-12-13 DIAGNOSIS — I420 Dilated cardiomyopathy: Secondary | ICD-10-CM | POA: Insufficient documentation

## 2017-12-13 DIAGNOSIS — I2510 Atherosclerotic heart disease of native coronary artery without angina pectoris: Secondary | ICD-10-CM | POA: Insufficient documentation

## 2017-12-13 DIAGNOSIS — R001 Bradycardia, unspecified: Secondary | ICD-10-CM | POA: Diagnosis not present

## 2017-12-13 DIAGNOSIS — I4892 Unspecified atrial flutter: Secondary | ICD-10-CM | POA: Diagnosis not present

## 2017-12-13 DIAGNOSIS — I428 Other cardiomyopathies: Secondary | ICD-10-CM | POA: Diagnosis not present

## 2017-12-13 DIAGNOSIS — Z79899 Other long term (current) drug therapy: Secondary | ICD-10-CM | POA: Diagnosis not present

## 2017-12-13 DIAGNOSIS — E7800 Pure hypercholesterolemia, unspecified: Secondary | ICD-10-CM | POA: Insufficient documentation

## 2017-12-13 DIAGNOSIS — I5042 Chronic combined systolic (congestive) and diastolic (congestive) heart failure: Secondary | ICD-10-CM | POA: Diagnosis not present

## 2017-12-13 DIAGNOSIS — Z955 Presence of coronary angioplasty implant and graft: Secondary | ICD-10-CM | POA: Diagnosis not present

## 2017-12-13 HISTORY — PX: BIV ICD INSERTION CRT-D: EP1195

## 2017-12-13 LAB — PROTIME-INR
INR: 2.38
Prothrombin Time: 25.8 seconds — ABNORMAL HIGH (ref 11.4–15.2)

## 2017-12-13 LAB — SURGICAL PCR SCREEN
MRSA, PCR: NEGATIVE
STAPHYLOCOCCUS AUREUS: POSITIVE — AB

## 2017-12-13 SURGERY — BIV ICD INSERTION CRT-D
Anesthesia: LOCAL

## 2017-12-13 MED ORDER — MIDAZOLAM HCL 5 MG/5ML IJ SOLN
INTRAMUSCULAR | Status: AC
  Filled 2017-12-13: qty 5

## 2017-12-13 MED ORDER — SODIUM CHLORIDE 0.9 % IR SOLN
Status: AC
  Filled 2017-12-13: qty 2

## 2017-12-13 MED ORDER — WARFARIN SODIUM 2.5 MG PO TABS: 4.5000 mg | ORAL_TABLET | ORAL | Status: DC

## 2017-12-13 MED ORDER — WARFARIN SODIUM 3 MG PO TABS: 3.0000 mg | ORAL_TABLET | ORAL | Status: DC

## 2017-12-13 MED ORDER — CEFAZOLIN SODIUM-DEXTROSE 1-4 GM/50ML-% IV SOLN
1.0000 g | Freq: Four times a day (QID) | INTRAVENOUS | Status: AC
  Administered 2017-12-13 – 2017-12-14 (×3): 1 g via INTRAVENOUS
  Filled 2017-12-13 (×4): qty 50

## 2017-12-13 MED ORDER — SODIUM CHLORIDE 0.9 % IR SOLN
80.0000 mg | Status: DC
  Filled 2017-12-13: qty 2

## 2017-12-13 MED ORDER — ACETAMINOPHEN 325 MG PO TABS: 325.0000 mg | ORAL_TABLET | ORAL | Status: DC | PRN

## 2017-12-13 MED ORDER — LIDOCAINE HCL (PF) 1 % IJ SOLN
INTRAMUSCULAR | Status: DC | PRN
  Administered 2017-12-13: 40 mL via SUBCUTANEOUS

## 2017-12-13 MED ORDER — MUPIROCIN 2 % EX OINT
1.0000 "application " | TOPICAL_OINTMENT | Freq: Once | CUTANEOUS | Status: AC
  Administered 2017-12-13: 1 via TOPICAL

## 2017-12-13 MED ORDER — IOPAMIDOL (ISOVUE-370) INJECTION 76%
INTRAVENOUS | Status: DC | PRN
  Administered 2017-12-13: 15 mL

## 2017-12-13 MED ORDER — FENTANYL CITRATE (PF) 100 MCG/2ML IJ SOLN
INTRAMUSCULAR | Status: AC
  Filled 2017-12-13: qty 2

## 2017-12-13 MED ORDER — SODIUM CHLORIDE 0.9 % IV SOLN
INTRAVENOUS | Status: DC
  Administered 2017-12-13: 07:00:00 via INTRAVENOUS

## 2017-12-13 MED ORDER — ACETAMINOPHEN 500 MG PO TABS
1000.0000 mg | ORAL_TABLET | Freq: Every day | ORAL | Status: DC | PRN
  Administered 2017-12-13: 1000 mg via ORAL
  Filled 2017-12-13: qty 2

## 2017-12-13 MED ORDER — MUPIROCIN 2 % EX OINT
TOPICAL_OINTMENT | CUTANEOUS | Status: AC
  Administered 2017-12-13: 1 via TOPICAL
  Filled 2017-12-13: qty 22

## 2017-12-13 MED ORDER — SPIRONOLACTONE 25 MG PO TABS
25.0000 mg | ORAL_TABLET | Freq: Every day | ORAL | Status: DC
  Administered 2017-12-13 – 2017-12-14 (×2): 25 mg via ORAL
  Filled 2017-12-13 (×2): qty 1

## 2017-12-13 MED ORDER — FUROSEMIDE 20 MG PO TABS
20.0000 mg | ORAL_TABLET | ORAL | Status: DC
  Administered 2017-12-13: 20 mg via ORAL
  Filled 2017-12-13: qty 1

## 2017-12-13 MED ORDER — WARFARIN - PHYSICIAN DOSING INPATIENT: Freq: Every day | Status: DC

## 2017-12-13 MED ORDER — HEPARIN SODIUM (PORCINE) 1000 UNIT/ML IJ SOLN
INTRAMUSCULAR | Status: DC | PRN
  Administered 2017-12-13: 500 [IU] via INTRAVENOUS

## 2017-12-13 MED ORDER — LOSARTAN POTASSIUM 50 MG PO TABS
100.0000 mg | ORAL_TABLET | Freq: Every day | ORAL | Status: DC
  Administered 2017-12-13 – 2017-12-14 (×2): 100 mg via ORAL
  Filled 2017-12-13 (×2): qty 2

## 2017-12-13 MED ORDER — MIDAZOLAM HCL 5 MG/5ML IJ SOLN
INTRAMUSCULAR | Status: DC | PRN
  Administered 2017-12-13 (×5): 1 mg via INTRAVENOUS

## 2017-12-13 MED ORDER — TAMSULOSIN HCL 0.4 MG PO CAPS: 0.4000 mg | ORAL_CAPSULE | Freq: Every day | ORAL | Status: DC | PRN

## 2017-12-13 MED ORDER — SAW PALMETTO 450 MG PO CAPS: 1350.0000 mg | ORAL_CAPSULE | Freq: Every day | ORAL | Status: DC

## 2017-12-13 MED ORDER — CEFAZOLIN SODIUM-DEXTROSE 2-4 GM/100ML-% IV SOLN
2.0000 g | INTRAVENOUS | Status: DC
  Filled 2017-12-13: qty 100

## 2017-12-13 MED ORDER — HEPARIN (PORCINE) IN NACL 2-0.9 UNIT/ML-% IJ SOLN
INTRAMUSCULAR | Status: AC
  Filled 2017-12-13: qty 500

## 2017-12-13 MED ORDER — ONDANSETRON HCL 4 MG/2ML IJ SOLN: 4.0000 mg | Freq: Four times a day (QID) | INTRAMUSCULAR | Status: DC | PRN

## 2017-12-13 MED ORDER — CEFAZOLIN SODIUM-DEXTROSE 2-4 GM/100ML-% IV SOLN
INTRAVENOUS | Status: AC
  Filled 2017-12-13: qty 100

## 2017-12-13 MED ORDER — CEFAZOLIN SODIUM-DEXTROSE 2-3 GM-%(50ML) IV SOLR
INTRAVENOUS | Status: AC | PRN
  Administered 2017-12-13: 2 g via INTRAVENOUS

## 2017-12-13 MED ORDER — IOPAMIDOL (ISOVUE-370) INJECTION 76%
INTRAVENOUS | Status: AC
  Filled 2017-12-13: qty 50

## 2017-12-13 MED ORDER — LIDOCAINE HCL (PF) 1 % IJ SOLN
INTRAMUSCULAR | Status: AC
  Filled 2017-12-13: qty 60

## 2017-12-13 MED ORDER — FENTANYL CITRATE (PF) 100 MCG/2ML IJ SOLN
INTRAMUSCULAR | Status: DC | PRN
  Administered 2017-12-13: 25 ug via INTRAVENOUS
  Administered 2017-12-13 (×3): 12.5 ug via INTRAVENOUS

## 2017-12-13 SURGICAL SUPPLY — 17 items
ADAPTER SEALING SSA-EW-09 (MISCELLANEOUS) ×2 IMPLANT
CABLE SURGICAL S-101-97-12 (CABLE) IMPLANT
CATH ATTAIN COM SURV 6250V-MB2 (CATHETERS) ×2 IMPLANT
CATH HEX JOSEPH 2-5-2 65CM 6F (CATHETERS) ×2 IMPLANT
ICD CLARIA MRI DTMA1Q1 (ICD Generator) ×2 IMPLANT
KIT ESSENTIALS PG (KITS) ×2 IMPLANT
LEAD ATTAIN PERFORMA S 4598-88 (Lead) ×2 IMPLANT
LEAD CAPSURE NOVUS 5076-52CM (Lead) ×2 IMPLANT
LEAD SPRINT QUAT SEC 6935-58CM (Lead) ×2 IMPLANT
PAD DEFIB LIFELINK (PAD) IMPLANT
SHEATH CLASSIC 7F (SHEATH) ×2 IMPLANT
SHEATH CLASSIC 9.5F (SHEATH) ×2 IMPLANT
SHEATH CLASSIC 9F (SHEATH) ×2 IMPLANT
SLITTER 6232ADJ (MISCELLANEOUS) ×2 IMPLANT
TRAY PACEMAKER INSERTION (PACKS) IMPLANT
WIRE ACUITY WHISPER EDS 4648 (WIRE) ×2 IMPLANT
WIRE HI TORQ VERSACORE-J 145CM (WIRE) ×2 IMPLANT

## 2017-12-14 ENCOUNTER — Ambulatory Visit (HOSPITAL_COMMUNITY): Payer: Medicare Other

## 2017-12-14 DIAGNOSIS — E7800 Pure hypercholesterolemia, unspecified: Secondary | ICD-10-CM | POA: Diagnosis not present

## 2017-12-14 DIAGNOSIS — I110 Hypertensive heart disease with heart failure: Secondary | ICD-10-CM | POA: Diagnosis not present

## 2017-12-14 DIAGNOSIS — I447 Left bundle-branch block, unspecified: Secondary | ICD-10-CM | POA: Diagnosis not present

## 2017-12-14 DIAGNOSIS — I420 Dilated cardiomyopathy: Secondary | ICD-10-CM

## 2017-12-14 LAB — PROTIME-INR
INR: 1.92
Prothrombin Time: 21.8 seconds — ABNORMAL HIGH (ref 11.4–15.2)

## 2017-12-14 MED FILL — Gentamicin Sulfate Inj 40 MG/ML: INTRAMUSCULAR | Qty: 80 | Status: AC

## 2017-12-14 MED FILL — Heparin Sodium (Porcine) 2 Unit/ML in Sodium Chloride 0.9%: INTRAMUSCULAR | Qty: 500 | Status: AC

## 2017-12-14 MED FILL — Midazolam HCl Inj 5 MG/5ML (Base Equivalent): INTRAMUSCULAR | Qty: 5 | Status: AC

## 2017-12-14 NOTE — Progress Notes (Signed)
Reviewed discharge instructions/medication with patient and family. Answered their questions. Pt is stable and ready for discharge.

## 2017-12-14 NOTE — H&P (Signed)
HPI [name] is referred today by Dr. Swaziland for evaluation of atrial flutter as well as chronic systolic heart failure, class 1, EF 25%, LBBB, and sinus bradycardia. He is a remarkable man. He underwent AVR in 1986 and again in 1996. He has been on warfarin since then. He was seen in December with a wide qrs tachy at 160/min. He was minimally symptomatic. He was treated with IV adenosine and then DCCV. He did not feel palpitations. He has been stable in the interim. He still works daily in a [employer] He has not had syncope. He deneis peripheral edema. He is on good medical therapy with an ARB and a diuretic but cannot take a beta blocker due to bradycardia. His LBBB QRS duration is 182 ms. Allergies  Allergen Reactions  . Bee Venom Anaphylaxis           Current Outpatient Medications  Medication Sig Dispense Refill  . acetaminophen (TYLENOL) 325 MG tablet Take 325 mg by mouth every 6 (six) hours as needed (for pain or headaches).     . furosemide (LASIX) 20 MG tablet Take 20 mg as needed by mouth for fluid or edema.    Marland Kitchen losartan (COZAAR) 100 MG tablet Take 1 tablet (100 mg total) by mouth daily. 90 tablet 3  . rosuvastatin (CRESTOR) 5 MG tablet TAKE 1 TABLET BY MOUTH 3 TIMES PER WEEK (Patient taking differently: Take 5 mg by mouth once a day on Mon/Wed/Fri) 36 tablet 4  . Saw Palmetto, Serenoa repens, (SAW PALMETTO PO) Take 1 tablet by mouth daily.     . sildenafil (REVATIO) 20 MG tablet Take 20 mg by mouth as needed.    Marland Kitchen spironolactone (ALDACTONE) 25 MG tablet Take 1 tablet (25 mg total) by mouth daily. 90 tablet 3  . tamsulosin (FLOMAX) 0.4 MG CAPS capsule Take 0.4 mg by mouth daily as needed (for urinary symptoms).     . warfarin (COUMADIN) 3 MG tablet Take 1.5-3 mg by mouth See admin instructions. 3 mg in the morning on Sun/Mon/Wed/Thurs/Sat and 1.5 mg on Tues/Fri     No current facility-administered medications for this visit.          Past Medical History:   Diagnosis Date  . Aortic stenosis    a. s/p redo aortic valve replacement in 1996 with mechanical prosthesis. On Coumadin  . Atrial flutter (HCC)    a. s/p DCCV in 08/2017  . Bundle branch block, left   . Chronic combined systolic and diastolic CHF (congestive heart failure) (HCC)    a. 2012: Echo showing EF of 25-30% b. 02/2016: Echo with EF of 20-25%, Grade 2 DD, mechanical AV functioning normally, PA Pressure 37 mm Hg  . Coronary artery disease 05/1995   a. s/p PCI with stent placement in 1996.  . Dilated cardiomyopathy (HCC)   . Elevated fasting blood sugar   . Hypercholesterolemia   . Hypertension     ROS:   All systems reviewed and negative except as noted in the HPI.        Past Surgical History:  Procedure Laterality Date  . AORTIC VALVE REPLACEMENT  1996          Family History  Problem Relation Age of Onset  . CAD Father      Social History        Socioeconomic History  . Marital status: Married    Spouse name: Not on file  . Number of children: Not on file  .  Years of education: Not on file  . Highest education level: Not on file  Social Needs  . Financial resource strain: Not on file  . Food insecurity - worry: Not on file  . Food insecurity - inability: Not on file  . Transportation needs - medical: Not on file  . Transportation needs - non-medical: Not on file  Occupational History  . Not on file  Tobacco Use  . Smoking status: Former Games developer  . Smokeless tobacco: Never Used  Substance and Sexual Activity  . Alcohol use: No    Alcohol/week: 0.0 oz  . Drug use: No  . Sexual activity: Not on file  Other Topics Concern  . Not on file  Social History Narrative  . Not on file     BP 122/66   Pulse (!) 58   Ht 5\' 7"  (1.702 m)   Wt 139 lb (63 kg)   BMI 21.77 kg/m   Physical Exam:  Well appearing 84 yo man, NAD HEENT: Unremarkable Neck:  5 cm JVD, no thyromegally Lymphatics:  No adenopathy Back:   No CVA tenderness Lungs:  Clear with no wheezes HEART:  Regular rate rhythm, no murmurs, no rubs, no clicks, mechanical S2 Abd:  soft, positive bowel sounds, no organomegally, no rebound, no guarding Ext:  2 plus pulses, no edema, no cyanosis, no clubbing Skin:  No rashes no nodules Neuro:  CN II through XII intact, motor grossly intact  EKG sinus bradycardia with LBBB  Assess/Plan: 1. Atrial flutter - he will continue his systemic anti-coagulation. He is not on any medical therapy due to his propensity for bradycardia. 2. Chronic systolic heart failure - he has class 1 symptoms. I have discussed the indications for BiV ICD or PM insertion (EF 20%, LBBB, QRS 180) and he will consider his options. He is not sure that with his lack of symptoms he wishes to proceed. 3. Sinus bradycardia - he has sinus node dysfunction as well as significant conduction system disease and will not be able to take a beta blocker.  Christopher Mann.           EP Attending  Patient seen and examined. Since prior clinic visit, no change. He is stable to undergo BiV ICD insertion. I have reviewed the indications/risks/benefits/goals/expectations of the procedure and he wishes to proceed.  Leonia Reeves.D.

## 2017-12-14 NOTE — Discharge Summary (Signed)
Discharge Summary    Patient ID: Christopher Mann,  MRN: [id number], DOB/AGE: 1930-03-19 84 y.o.  Admit date: 12/13/2017 Discharge date: 12/14/2017  Primary Care Provider: Delano Metz A Primary Cardiologist: Lewayne Bunting, MD  Discharge Diagnoses    Active Problems:   Bundle branch block, left   Dilated cardiomyopathy [facility])   ICD (implantable cardioverter-defibrillator) in place   Allergies Allergies  Allergen Reactions  . Bee Venom Anaphylaxis  . Pork-Derived Products     Diagnostic Studies/Procedures    ICD Insertion, 12/13/2017 PROCEDURES:  1. Biventricular ICD implantation. 2. Defibrillation threshold testing 3. Venography of the coronary sinus CONCLUSIONS:   1. Nonischemic cardiomyopathy with Left bundle-branch block and chronic New York Heart Association class 2 heart failure.   2. Successful biventricular ICD implantation.   3. DFT less than or equal to 20 joules.   4. No early apparent complications.   _____________   History of Present Illness     Christopher Mann was referred to EP by Dr. Swaziland for evaluation of atrial flutter as well as chronic systolic heart failure, class 1, EF 25%, LBBB, and sinus bradycardia. He is a remarkable man. He underwent AVR in 1986 and again in 1996. He has been on warfarin since then. He was seen in December with a wide qrs tachy at 160/min. He was minimally symptomatic. He was treated with IV adenosine and then DCCV. He did not feel palpitations. He has been stable in the interim. He still works daily in a [employer] He has not had syncope. He deneis peripheral edema. He is on good medical therapy with an ARB and a diuretic but cannot take a beta blocker due to bradycardia. His LBBB QRS duration is 182 ms.  The decision was made to insert a BiV ICD. Christopher Mann came to the hospital on 12/13/2017 for the procedure.   Hospital Course     Consultants: none   Christopher Mann had a Medtronic (serial Number [id number]) biventricular  ICD inserted with no immediate complication.   On 12/14/2017, he was seen by Dr Ladona Ridgel and all data were reviewed. VS and physical exam are stable. CXR w/out PTX.  Device functioning well. Telemetry shows atrial pacing and SR _____________  Discharge Vitals Blood pressure 126/68, pulse (!) 58, temperature 98.5 F (36.9 C), temperature source Oral, resp. rate 18, height 5\' 8"  (1.727 m), weight 134 lb 11.2 oz (61.1 kg), SpO2 94 %.  Filed Weights   12/13/17 0606 12/13/17 1159 12/14/17 0711  Weight: 140 lb (63.5 kg) 137 lb 5.6 oz (62.3 kg) 134 lb 11.2 oz (61.1 kg)  General: Well developed, well nourished, male in no acute distress Head: Eyes PERRLA, No xanthomas.   Normocephalic and atraumatic  Lungs: Clear bilaterally to auscultation. Minimal ecchymosis at ICD site Heart: HRRR S1 S2, without MRG.  Pulses are 2+ & equal.   Abdomen: Bowel sounds are present, abdomen soft and non-tender without masses or  hernias noted. Msk: Normal strength and tone for age. Extremities: No clubbing, cyanosis or edema.    Skin:  No rashes or lesions noted. Neuro: Alert and oriented X 3. Psych:  Good affect, responds appropriately   Labs & Radiologic Studies    CBC Recent Labs    12/11/17 1047  WBC 5.1  NEUTROABS 3.6  HGB 12.6*  HCT 36.9*  MCV 87  PLT 196   Basic Metabolic Panel Recent Labs    65/53/74 1047  NA 140  K 4.6  CL 112*  CO2 22  GLUCOSE 85  BUN 35*  CREATININE 1.58*  CALCIUM 9.9   Lab Results  Component Value Date   INR 1.92 12/14/2017   INR 2.38 12/13/2017   INR 2.8 (H) 12/11/2017    _____________  Dg Chest 2 View  Result Date: 12/14/2017 CLINICAL DATA:  ICD in place EXAM: CHEST  2 VIEW COMPARISON:  10/23/2017 chest radiograph. FINDINGS: Three lead left subclavian ICD is noted with lead tips overlying the right atrium, right ventricle and coronary sinus. Aortic valve prosthesis is in place. Intact sternotomy wires. Stable cardiomediastinal silhouette with mild  cardiomegaly. No pneumothorax. No pleural effusion. No pulmonary edema. Mild left basilar atelectasis. IMPRESSION: 1. Three lead left subclavian ICD is in place.  No pneumothorax. 2. Stable mild cardiomegaly without pulmonary edema. 3. Mild left basilar atelectasis. Electronically Signed   By: Delbert Phenix M.D.   On: 12/14/2017 08:09   Disposition   Pt is being discharged home today in good condition.  Follow-up Plans & Appointments     Discharge Instructions    Call MD for:  redness, tenderness, or signs of infection (pain, swelling, redness, odor or green/yellow discharge around incision site)   Complete by:  As directed    Diet - low sodium heart healthy   Complete by:  As directed    Increase activity slowly   Complete by:  As directed       Discharge Medications   Allergies as of 12/14/2017      Reactions   Bee Venom Anaphylaxis   Pork-derived Products       Medication List    TAKE these medications   acetaminophen 500 MG tablet Commonly known as:  TYLENOL Take 1,000 mg by mouth daily as needed for moderate pain.   furosemide 20 MG tablet Commonly known as:  LASIX Take 20 mg by mouth 2 (two) times a week. Take Mondays and Thursdays   losartan 100 MG tablet Commonly known as:  COZAAR Take 1 tablet (100 mg total) by mouth daily.   OVER THE COUNTER MEDICATION Take 3 capsules by mouth daily. OCC dietary supplement   rosuvastatin 5 MG tablet Commonly known as:  CRESTOR TAKE 1 TABLET BY MOUTH 3 TIMES PER WEEK What changed:  See the new instructions.   Saw Palmetto 450 MG Caps Take 1,350 mg by mouth daily.   sildenafil 20 MG tablet Commonly known as:  REVATIO Take 20 mg by mouth as needed (erectile dysfunction).   spironolactone 25 MG tablet Commonly known as:  ALDACTONE Take 1 tablet (25 mg total) by mouth daily.   tamsulosin 0.4 MG Caps capsule Commonly known as:  FLOMAX Take 0.4 mg by mouth daily as needed (for urinary symptoms).   warfarin 3 MG  tablet Commonly known as:  COUMADIN Take 3-4.5 mg by mouth See admin instructions. Take 3 mg in the morning on Sun/Tue/Wed/Thurs/Sat and 4.5 mg on Mon/Fri          Outstanding Labs/Studies   None  Duration of Discharge Encounter   Greater than 30 minutes including physician time.  Bobbye Riggs Barrett NP 12/14/2017, 8:47 AM  EP Attending  Patient seen and examined. Agree with above. His BiV ICD has been interogated under my direct supervision and found to be working normally. His CXR looks good. He will be discharged home with usual followup.  Leonia Reeves.D.

## 2017-12-17 ENCOUNTER — Telehealth: Payer: Self-pay | Admitting: Cardiology

## 2017-12-17 NOTE — Telephone Encounter (Signed)
Spoke with wife and she can not do Wednesday appointments. She would like to rescheduled and is unsure if should be with Dr Swaziland since she saw PA last time. This is just a follow up visit  Will forward to [facility] LPN with Dr Swaziland to call and reschedule appointment.

## 2017-12-17 NOTE — Telephone Encounter (Signed)
New message  Pt wife verbalized that she is calling for the RN  Pt wife want another date and she want something   As in this week and if it is further out she want rn to tell her that it its safe

## 2017-12-18 ENCOUNTER — Encounter (HOSPITAL_COMMUNITY): Payer: Self-pay | Admitting: Internal Medicine

## 2017-12-18 NOTE — Progress Notes (Deleted)
Cardiology Office Note    Date:  12/18/2017   ID:  Christopher Mann, DOB 1930-07-30, MRN [id number]  PCP:  Vivien Presto, MD  Cardiologist: Dr. Swaziland    No chief complaint on file.   History of Present Illness:    Christopher Mann is a 84 y.o. male  with past medical history of severe AS (s/p redo AVR with mechanical prosthesis in 1996, on Coumadin), CAD (s/p PCI in 1996), PAflutter (s/p DCCV on 09/06/2017), chronic systolic CHF (EF 40-98% by echo in 09/2017), chronic LBBB, HTN, and HLD who is seen for follow up.   He was seen on 09/06/2017 and was noted to be a wide-complex tachycardia but thought to be most consistent with atrial flutter with 2:1 AV Conduction with HR of 157. It was recommended he proceed to the ED for consideration of DCCV if INR was therapeutic. DCCV was pursued at 200J with successful conversion to NSR. He was not started on BB therapy due to his history of bradycardia.   At the time of his follow-up office visit on 09/12/2017, he reported worsening dyspnea on exertion and orthopnea since his recent DCCV. A BNP was checked and found to be elevated to 1240. He was started on Lasix 20mg  daily for the next 5 days then as needed for weight gain.    He was back in the ED on 10/23/17 with atrial flutter with RVR. Again underwent DCCV. Seen in follow up with Dr. Ladona Ridgel and underwent placement of a BiV ICD on 12/14/17.  In talking with the patient today, he reports significant improvement in his symptoms since his last office visit.  He notes that breathing is now at baseline and denies any orthopnea, PND, or lower extremity edema.  Weight has declined from 146 lbs at the time of his visit on 10/31 to 139 lbs today. Notes similar improvement on his home scales. Denies any recent chest pain, palpitations, lightheadedness, dizziness, or presyncope. He has been wearing an i-watch to monitor his HR and this has remained stable in the 50's - 60's at rest.   Past Medical History:    Diagnosis Date  . Aortic stenosis    a. s/p redo aortic valve replacement in 1996 with mechanical prosthesis. On Coumadin  . Atrial flutter (HCC)    a. s/p DCCV in 08/2017  . Bundle branch block, left   . Chronic combined systolic and diastolic CHF (congestive heart failure) (HCC)    a. 2012: Echo showing EF of 25-30% b. 02/2016: Echo with EF of 20-25%, Grade 2 DD, mechanical AV functioning normally, PA Pressure 37 mm Hg  . Coronary artery disease 05/1995   a. s/p PCI with stent placement in 1996.  . Dilated cardiomyopathy (HCC)   . Elevated fasting blood sugar   . Hypercholesterolemia   . Hypertension     Past Surgical History:  Procedure Laterality Date  . AORTIC VALVE REPLACEMENT  1996  . BIV ICD INSERTION CRT-D N/A 12/13/2017   Procedure: BIV ICD INSERTION CRT-D;  Surgeon: Marinus Maw, MD;  Location: [facility] INVASIVE CV LAB;  Service: Cardiovascular;  Laterality: N/A;    Current Medications: Outpatient Medications Prior to Visit  Medication Sig Dispense Refill  . acetaminophen (TYLENOL) 500 MG tablet Take 1,000 mg by mouth daily as needed for moderate pain.    . furosemide (LASIX) 20 MG tablet Take 20 mg by mouth 2 (two) times a week. Take Mondays and Thursdays    . losartan (COZAAR) 100  MG tablet Take 1 tablet (100 mg total) by mouth daily. 90 tablet 3  . OVER THE COUNTER MEDICATION Take 3 capsules by mouth daily. OCC dietary supplement    . rosuvastatin (CRESTOR) 5 MG tablet TAKE 1 TABLET BY MOUTH 3 TIMES PER WEEK (Patient taking differently: Take 5 mg by mouth once a day on Mon/Wed/Fri) 36 tablet 4  . Saw Palmetto 450 MG CAPS Take 1,350 mg by mouth daily.    . sildenafil (REVATIO) 20 MG tablet Take 20 mg by mouth as needed (erectile dysfunction).     Marland Kitchen spironolactone (ALDACTONE) 25 MG tablet Take 1 tablet (25 mg total) by mouth daily. 90 tablet 3  . tamsulosin (FLOMAX) 0.4 MG CAPS capsule Take 0.4 mg by mouth daily as needed (for urinary symptoms).     . warfarin (COUMADIN)  3 MG tablet Take 3-4.5 mg by mouth See admin instructions. Take 3 mg in the morning on Sun/Tue/Wed/Thurs/Sat and 4.5 mg on Mon/Fri     No facility-administered medications prior to visit.      Allergies:   Bee venom and Pork-derived products   Social History   Socioeconomic History  . Marital status: Married    Spouse name: Not on file  . Number of children: Not on file  . Years of education: Not on file  . Highest education level: Not on file  Social Needs  . Financial resource strain: Not on file  . Food insecurity - worry: Not on file  . Food insecurity - inability: Not on file  . Transportation needs - medical: Not on file  . Transportation needs - non-medical: Not on file  Occupational History  . Not on file  Tobacco Use  . Smoking status: Former Games developer  . Smokeless tobacco: Never Used  Substance and Sexual Activity  . Alcohol use: No    Alcohol/week: 0.0 oz  . Drug use: No  . Sexual activity: Not on file  Other Topics Concern  . Not on file  Social History Narrative  . Not on file     Family History:  The patient's family history includes CAD in his father.   Review of Systems:   Please see the history of present illness.     General:  No chills, fever, night sweats or weight changes.  Cardiovascular:  No chest pain, dyspnea on exertion, edema, orthopnea, palpitations, paroxysmal nocturnal dyspnea. Dermatological: No rash, lesions/masses Respiratory: No dyspnea. Positive for dry cough.  Urologic: No hematuria, dysuria Abdominal:   No nausea, vomiting, diarrhea, bright red blood per rectum, melena, or hematemesis Neurologic:  No visual changes, wkns, changes in mental status.  All other systems reviewed and are otherwise negative except as noted above.   Physical Exam:    VS:  There were no vitals taken for this visit.   General: Well developed, well nourished Caucasian male appearing in no acute distress. Head: Normocephalic, atraumatic, sclera  non-icteric, no xanthomas, nares are without discharge.  Neck: No carotid bruits. JVD not elevated.  Lungs: Respirations regular and unlabored, without wheezes or rales.  Heart: Regular rate and rhythm. No S3 or S4.  No murmur, no rubs, or gallops appreciated. Crisp mechanical valve sounds appreciated.  Abdomen: Soft, non-tender, non-distended with normoactive bowel sounds. No hepatomegaly. No rebound/guarding. No obvious abdominal masses. Msk:  Strength and tone appear normal for age. No joint deformities or effusions. Extremities: No clubbing or cyanosis. No lower extremity edema.  Distal pedal pulses are 2+ bilaterally. Neuro: Alert and oriented X  3. Moves all extremities spontaneously. No focal deficits noted. Psych:  Responds to questions appropriately with a normal affect. Skin: No rashes or lesions noted  Wt Readings from Last 3 Encounters:  12/14/17 134 lb 11.2 oz (61.1 kg)  11/26/17 139 lb (63 kg)  10/26/17 145 lb 3.2 oz (65.9 kg)     Studies/Labs Reviewed:   EKG:  EKG is not ordered today.    Recent Labs: 09/06/2017: ALT 59 09/12/2017: BNP 1,240.2 12/11/2017: BUN 35; Creatinine, Ser 1.58; Hemoglobin 12.6; Platelets 196; Potassium 4.6; Sodium 140   Lipid Panel    Component Value Date/Time   CHOL 168 09/18/2017 0859   TRIG 79 09/18/2017 0859   HDL 38 (L) 09/18/2017 0859   CHOLHDL 4.4 09/18/2017 0859   CHOLHDL 5.5 (H) 10/24/2016 0941   VLDL 28 10/24/2016 0941   LDLCALC 114 (H) 09/18/2017 0859   LDLDIRECT 109.7 12/11/2013 1045    Additional studies/ records that were reviewed today include:   Echocardiogram: 09/13/2017 Study Conclusions  - Left ventricle: The cavity size was moderately dilated. Wall   thickness was increased in a pattern of moderate LVH. Systolic   function was severely reduced. The estimated ejection fraction   was in the range of 20% to 25%. Features are consistent with a   pseudonormal left ventricular filling pattern, with concomitant    abnormal relaxation and increased filling pressure (grade 2   diastolic dysfunction). - Aortic valve: A mechanical prosthesis was present. There was   trivial regurgitation. - Mitral valve: There was mild regurgitation. - Left atrium: The atrium was severely dilated. - Right atrium: The atrium was mildly dilated. - Pulmonary arteries: Systolic pressure was mildly increased. PA   peak pressure: 36 mm Hg (S).  Assessment:    No diagnosis found.   Plan:   In order of problems listed above:  1. Chronic Combined Systolic and Diastolic CHF - the patient has a known reduced EF of 20-25% by echo in 09/2017, which is similar to prior tracings.  - he was volume overloaded at the time of his last office visit and found to have a BNP of 1240. Was prescribed Lasix 20mg  daily for the next 5 days then as needed for weight gain.   - weight has declined 7 lbs (146 --> 139 lbs) and he reports resolution of his dyspnea and orthopnea. He does not appear volume overloaded by physical examination.  - will continue with PRN Lasix dosing for weight gain of > 3 lbs overnight or > 5 lbs in one week. Continue Losartan and Spironolactone. Not on BB therapy due to history of bradycardia. Not an ideal Entresto candidate with his variable renal function and borderline hypotension.   2. Typical Atrial Flutter - s/p DCCV on 09/06/2017. He denies any recurrent palpitations.  - continue Coumadin for anticoagulation.   3. Aortic Stenosis - s/p redo AVR with mechanical prosthesis in 1996. - recent echocardiogram showed mechanical prosthesis was functioning normally with trivial AI.   4. CAD - s/p PCI in 1996. - he denies any recent chest discomfort or dyspnea on exertion.  - continue statin therapy. No ASA secondary to the need for Coumadin and not on BB therapy due to bradycardia.   5. HTN - BP is well-controlled at 118/60 during today's visit. - continue current medication regimen.  6. HLD - on Crestor 5mg   three times weekly. Intolerant to higher-dosing.  - will recheck FLP and LFT's today.   Medication Adjustments/Labs and Tests Ordered: Current medicines  are reviewed at length with the patient today.  Concerns regarding medicines are outlined above.  Medication changes, Labs and Tests ordered today are listed in the Patient Instructions below. Patient Instructions  Medication Instructions: Your physician recommends that you continue on your current medications as directed. Please refer to the Current Medication list given to you today.  Labwork: Your physician recommends that you have lab work today : Fasting Lipid  Procedures/Testing: None Ordered  Follow-Up: Your physician recommends that you keep your follow-up appointment as scheduled in February with Dr. Swaziland.   If you need a refill on your cardiac medications before your next appointment, please call your pharmacy.    Signed, Marquite Attwood Swaziland, MD  12/18/2017 7:14 AM    [facility] [address] [place], [address] [place], [place]  [address] Phone: [phone]; Fax: [phone]  [address] [place], [place] [address] Phone: [phone]

## 2017-12-19 ENCOUNTER — Ambulatory Visit: Payer: Medicare Other | Admitting: Cardiology

## 2017-12-19 NOTE — Telephone Encounter (Signed)
Returned call to patient's wife.She stated someone already scheduled appointment with Dr.Jordan 12/28/17 at 1:40 PM.

## 2017-12-20 ENCOUNTER — Ambulatory Visit: Payer: Medicare Other | Admitting: Cardiology

## 2017-12-21 ENCOUNTER — Encounter: Payer: Self-pay | Admitting: Internal Medicine

## 2017-12-26 ENCOUNTER — Ambulatory Visit: Payer: Medicare Other

## 2017-12-26 NOTE — Progress Notes (Signed)
Christopher Mann Date of Birth: [birth year]   History of Present Illness: Christopher Mann is seen for follow up CHF and AVR. He has a known history of valvular heart disease. He is status post redo aortic valve replacement in 1996 with a mechanical prosthesis at [facility]. He also has a history of coronary disease with a remote coronary stent. He has a history of dilated cardiomyopathy with ejection fraction of 20-25% by echocardiogram in April 2017. He is on chronic Coumadin therapy. He has a history of HTN and HLD. He was seen in October with atrial flutter with RVR rate 160. He was asymptomatic. He was sent to ED and failed to slow with Adenosine. He then underwent DCCV. Was seen in follow up with EP. Not a candidate for beta blocker due to sinus node dysfunction. Underwent placement of BiV ICD on 12/13/17.   On follow up today he is doing very well. He has been taking lasix twice a week but has no swelling or dyspnea. Weight is at baseline. Energy level is good. No dizziness or chest pain.  Current Outpatient Medications on File Prior to Visit  Medication Sig Dispense Refill  . acetaminophen (TYLENOL) 500 MG tablet Take 1,000 mg by mouth daily as needed for moderate pain.    . furosemide (LASIX) 20 MG tablet Take 20 mg by mouth 2 (two) times a week. Take Mondays and Thursdays    . losartan (COZAAR) 100 MG tablet Take 1 tablet (100 mg total) by mouth daily. 90 tablet 3  . OVER THE COUNTER MEDICATION Take 3 capsules by mouth daily. OCC dietary supplement    . rosuvastatin (CRESTOR) 5 MG tablet Take 1 tablet by mouth every Monday, Wednesday, and Friday.    . Saw Palmetto 450 MG CAPS Take 1,350 mg by mouth daily.    . sildenafil (REVATIO) 20 MG tablet Take 20 mg by mouth as needed (erectile dysfunction).     Marland Kitchen spironolactone (ALDACTONE) 25 MG tablet Take 1 tablet (25 mg total) by mouth daily. 90 tablet 3  . tamsulosin (FLOMAX) 0.4 MG CAPS capsule Take 0.4 mg by mouth daily as needed (for urinary  symptoms).     . warfarin (COUMADIN) 3 MG tablet Take 3-4.5 mg by mouth See admin instructions. Take 3 mg in the morning on Sun/Tue/Wed/Thurs/Sat and 4.5 mg on Mon/Fri     No current facility-administered medications on file prior to visit.     Allergies  Allergen Reactions  . Bee Venom Anaphylaxis  . Metoprolol     Other reaction(s): Other (See Comments) Bradycardia.  . Aspirin     Other reaction(s): Other (See Comments)  . Simvastatin     Other reaction(s): Other (See Comments) Muscle pain  . Pork-Derived Products     Past Medical History:  Diagnosis Date  . Aortic stenosis    a. s/p redo aortic valve replacement in 1996 with mechanical prosthesis. On Coumadin  . Atrial flutter (HCC)    a. s/p DCCV in 08/2017  . Bundle branch block, left   . Chronic combined systolic and diastolic CHF (congestive heart failure) (HCC)    a. 2012: Echo showing EF of 25-30% b. 02/2016: Echo with EF of 20-25%, Grade 2 DD, mechanical AV functioning normally, PA Pressure 37 mm Hg  . Coronary artery disease 05/1995   a. s/p PCI with stent placement in 1996.  . Dilated cardiomyopathy (HCC)   . Elevated fasting blood sugar   . Hypercholesterolemia   . Hypertension  Past Surgical History:  Procedure Laterality Date  . AORTIC VALVE REPLACEMENT  1996  . BIV ICD INSERTION CRT-D N/A 12/13/2017   Procedure: BIV ICD INSERTION CRT-D;  Surgeon: Marinus Maw, MD;  Location: [facility] INVASIVE CV LAB;  Service: Cardiovascular;  Laterality: N/A;    Social History   Tobacco Use  Smoking Status Former Smoker  Smokeless Tobacco Never Used    Social History   Substance and Sexual Activity  Alcohol Use No  . Alcohol/week: 0.0 oz    Family History  Problem Relation Age of Onset  . CAD Father     Review of Systems: As noted in history of present illness. All other systems were reviewed and are negative.  Physical Exam: BP 140/62   Pulse (!) 58   Ht 5' 7.5" (1.715 m)   Wt 139 lb 12.8 oz (63.4  kg)   BMI 21.57 kg/m  GENERAL:  Well appearing WM in NAD HEENT:  PERRL, EOMI, sclera are clear. Oropharynx is clear. NECK:  No jugular venous distention, carotid upstroke brisk and symmetric, no bruits, no thyromegaly or adenopathy LUNGS:  Clear to auscultation bilaterally CHEST:  Unremarkable, ICD site left chest healing well. HEART:  RRR,  PMI not displaced or sustained,S1 loud mechanical S2 , no S3, no S4: no clicks, no rubs, no murmurs ABD:  Soft, nontender. BS +, no masses or bruits. No hepatomegaly, no splenomegaly EXT:  2 + pulses throughout, no edema, no cyanosis no clubbing SKIN:  Warm and dry.  No rashes NEURO:  Alert and oriented x 3. Cranial nerves II through XII intact. PSYCH:  Cognitively intact      LABORATORY DATA: Lab Results  Component Value Date   WBC 5.1 12/11/2017   HGB 12.6 (L) 12/11/2017   HCT 36.9 (L) 12/11/2017   PLT 196 12/11/2017   GLUCOSE 85 12/11/2017   CHOL 168 09/18/2017   TRIG 79 09/18/2017   HDL 38 (L) 09/18/2017   LDLDIRECT 109.7 12/11/2013   LDLCALC 114 (H) 09/18/2017   ALT 59 09/06/2017   AST 60 (H) 09/06/2017   NA 140 12/11/2017   K 4.6 12/11/2017   CL 112 (H) 12/11/2017   CREATININE 1.58 (H) 12/11/2017   BUN 35 (H) 12/11/2017   CO2 22 12/11/2017   INR 1.92 12/14/2017   HGBA1C 6.4 (H) 01/15/2015    Ecg today shows NSR with atrial sensing and V pacing rate 58. I have personally reviewed and interpreted this study.   Labs reviewed from primary care. Findings:  INR on 01/27/16- 2.5 12/27/15: cholesterol 170, triglycerides 310, HDL 31, LDL 62. 08/03/15: CBC normal. BUN 31, creatinine 1.36. Potassium 4.3. Other chemistries OK. TSH. 4.68.  Echo   Study Conclusions  - Left ventricle: The cavity size was moderately dilated. Wall   thickness was increased in a pattern of moderate LVH. Systolic   function was severely reduced. The estimated ejection fraction   was in the range of 20% to 25%. Features are consistent with a    pseudonormal left ventricular filling pattern, with concomitant   abnormal relaxation and increased filling pressure (grade 2   diastolic dysfunction). - Aortic valve: A mechanical prosthesis was present. There was   trivial regurgitation. - Mitral valve: There was mild regurgitation. - Left atrium: The atrium was severely dilated. - Right atrium: The atrium was mildly dilated. - Pulmonary arteries: Systolic pressure was mildly increased. PA   peak pressure: 36 mm Hg (S).   Assessment / Plan: 1. Atrial flutter  s/p DCCV. Now with BiV ICD. Paced today. Maintaining NSR.   3. Chronic systolic CHF with dilated cardiomyopathy. Ejection fraction of 20-25%.  Continue treatment with ARB and Aldactone. Now s/p BiV ICD. Can take lasix prn. Will reassess LV function post BiV ICD in 2 months.  3. Status post redo aortic valve replacement with mechanical prosthesis. Functioning well by echocardiogram in April 2018.   3. Hypertension, controlled.  4. Coronary disease with remote coronary stent. Asymptomatic.  5. Hyperlipidemia. on statin. LDL OK.   6. DM last A1c 6.2%. Glucose improved with diet.    I will follow up in 3 months.

## 2017-12-27 ENCOUNTER — Ambulatory Visit (INDEPENDENT_AMBULATORY_CARE_PROVIDER_SITE_OTHER): Payer: Medicare Other | Admitting: *Deleted

## 2017-12-27 DIAGNOSIS — I420 Dilated cardiomyopathy: Secondary | ICD-10-CM | POA: Diagnosis not present

## 2017-12-27 LAB — CUP PACEART INCLINIC DEVICE CHECK
Battery Remaining Longevity: 98 mo
Battery Voltage: 3.05 V
Brady Statistic AS VP Percent: 97.5 %
HighPow Impedance: 54 Ohm
Implantable Lead Implant Date: 20190131
Implantable Lead Implant Date: 20190131
Implantable Lead Location: 753858
Implantable Lead Location: 753859
Implantable Lead Location: 753860
Implantable Lead Model: 5076
Implantable Lead Model: 6935
Implantable Pulse Generator Implant Date: 20190131
Lead Channel Impedance Value: 228 Ohm
Lead Channel Impedance Value: 399 Ohm
Lead Channel Impedance Value: 399 Ohm
Lead Channel Impedance Value: 399 Ohm
Lead Channel Impedance Value: 399 Ohm
Lead Channel Impedance Value: 399 Ohm
Lead Channel Impedance Value: 532 Ohm
Lead Channel Impedance Value: 665 Ohm
Lead Channel Impedance Value: 779 Ohm
Lead Channel Pacing Threshold Amplitude: 0.75 V
Lead Channel Pacing Threshold Pulse Width: 0.4 ms
Lead Channel Pacing Threshold Pulse Width: 0.4 ms
Lead Channel Sensing Intrinsic Amplitude: 19.875 mV
Lead Channel Setting Pacing Amplitude: 2.25 V
Lead Channel Setting Pacing Amplitude: 3.5 V
MDC IDC LEAD IMPLANT DT: 20190131
MDC IDC MSMT LEADCHNL LV IMPEDANCE VALUE: 199.5 Ohm
MDC IDC MSMT LEADCHNL LV IMPEDANCE VALUE: 199.5 Ohm
MDC IDC MSMT LEADCHNL LV IMPEDANCE VALUE: 199.5 Ohm
MDC IDC MSMT LEADCHNL LV IMPEDANCE VALUE: 228 Ohm
MDC IDC MSMT LEADCHNL LV IMPEDANCE VALUE: 646 Ohm
MDC IDC MSMT LEADCHNL LV IMPEDANCE VALUE: 703 Ohm
MDC IDC MSMT LEADCHNL LV IMPEDANCE VALUE: 703 Ohm
MDC IDC MSMT LEADCHNL LV IMPEDANCE VALUE: 817 Ohm
MDC IDC MSMT LEADCHNL RA PACING THRESHOLD AMPLITUDE: 0.75 V
MDC IDC MSMT LEADCHNL RA PACING THRESHOLD PULSEWIDTH: 0.4 ms
MDC IDC MSMT LEADCHNL RA SENSING INTR AMPL: 2.25 mV
MDC IDC MSMT LEADCHNL RV IMPEDANCE VALUE: 475 Ohm
MDC IDC MSMT LEADCHNL RV PACING THRESHOLD AMPLITUDE: 0.5 V
MDC IDC SESS DTM: 20190214113356
MDC IDC SET LEADCHNL LV PACING PULSEWIDTH: 0.4 ms
MDC IDC SET LEADCHNL RA PACING AMPLITUDE: 3.5 V
MDC IDC SET LEADCHNL RV PACING PULSEWIDTH: 0.4 ms
MDC IDC SET LEADCHNL RV SENSING SENSITIVITY: 0.3 mV
MDC IDC STAT BRADY AP VP PERCENT: 0.21 %
MDC IDC STAT BRADY AP VS PERCENT: 0.02 %
MDC IDC STAT BRADY AS VS PERCENT: 2.27 %
MDC IDC STAT BRADY RA PERCENT PACED: 0.23 %
MDC IDC STAT BRADY RV PERCENT PACED: 45.84 %

## 2017-12-27 NOTE — Progress Notes (Signed)
Wound check appointment. Steri-strips removed. Wound without redness or edema. Incision edges approximated, wound well healed. Normal device function. Thresholds, sensing, and impedances consistent with implant measurements. Device programmed at 3.5V for extra safety margin until 3 month visit. Histogram distribution appropriate for patient and level of activity. No mode switches or ventricular arrhythmias noted. Patient educated about wound care, arm mobility, lifting restrictions, shock plan. ROV with GT 03/15/18.

## 2017-12-28 ENCOUNTER — Encounter: Payer: Self-pay | Admitting: Cardiology

## 2017-12-28 ENCOUNTER — Ambulatory Visit (INDEPENDENT_AMBULATORY_CARE_PROVIDER_SITE_OTHER): Payer: Medicare Other | Admitting: Cardiology

## 2017-12-28 VITALS — BP 140/62 | HR 58 | Ht 67.5 in | Wt 139.8 lb

## 2017-12-28 DIAGNOSIS — I2510 Atherosclerotic heart disease of native coronary artery without angina pectoris: Secondary | ICD-10-CM

## 2017-12-28 DIAGNOSIS — I10 Essential (primary) hypertension: Secondary | ICD-10-CM

## 2017-12-28 DIAGNOSIS — I5022 Chronic systolic (congestive) heart failure: Secondary | ICD-10-CM

## 2017-12-28 DIAGNOSIS — Z952 Presence of prosthetic heart valve: Secondary | ICD-10-CM

## 2017-12-28 DIAGNOSIS — I483 Typical atrial flutter: Secondary | ICD-10-CM | POA: Diagnosis not present

## 2017-12-28 NOTE — Patient Instructions (Signed)
You may take lasix prn for swelling and weight gain  Continue your other therapy.   We will arrange for an Echocardiogram in 2 months.  I will see you in 3 months.

## 2018-02-26 ENCOUNTER — Other Ambulatory Visit (HOSPITAL_COMMUNITY): Payer: Medicare Other

## 2018-02-26 DIAGNOSIS — R0989 Other specified symptoms and signs involving the circulatory and respiratory systems: Secondary | ICD-10-CM

## 2018-02-27 ENCOUNTER — Ambulatory Visit: Payer: Medicare Other | Admitting: Internal Medicine

## 2018-03-02 ENCOUNTER — Telehealth: Payer: Self-pay | Admitting: Physician Assistant

## 2018-03-02 NOTE — Telephone Encounter (Signed)
Pt's wife called concerned that the patient is going shotgun shooting today. She asks if he is cleared to shoot a shotgun over 100 times following ICD insertion. In consultation with Dr. Graciela Husbands, the patient is permitted to shoot a gun if he does so on the opposite shoulder of his ICD. I clarified this with the wife and she expressed understanding.

## 2018-03-13 ENCOUNTER — Encounter: Payer: Medicare Other | Admitting: Internal Medicine

## 2018-03-15 ENCOUNTER — Other Ambulatory Visit: Payer: Self-pay

## 2018-03-15 ENCOUNTER — Ambulatory Visit (INDEPENDENT_AMBULATORY_CARE_PROVIDER_SITE_OTHER): Payer: Medicare Other | Admitting: Internal Medicine

## 2018-03-15 ENCOUNTER — Ambulatory Visit (HOSPITAL_COMMUNITY): Payer: Medicare Other | Attending: Cardiology

## 2018-03-15 ENCOUNTER — Other Ambulatory Visit (HOSPITAL_COMMUNITY): Payer: Medicare Other

## 2018-03-15 ENCOUNTER — Encounter: Payer: Self-pay | Admitting: Internal Medicine

## 2018-03-15 VITALS — BP 130/70 | HR 56 | Ht 67.0 in | Wt 140.8 lb

## 2018-03-15 DIAGNOSIS — Z952 Presence of prosthetic heart valve: Secondary | ICD-10-CM | POA: Insufficient documentation

## 2018-03-15 DIAGNOSIS — Z95810 Presence of automatic (implantable) cardiac defibrillator: Secondary | ICD-10-CM | POA: Diagnosis not present

## 2018-03-15 DIAGNOSIS — I420 Dilated cardiomyopathy: Secondary | ICD-10-CM | POA: Diagnosis not present

## 2018-03-15 DIAGNOSIS — I5022 Chronic systolic (congestive) heart failure: Secondary | ICD-10-CM | POA: Diagnosis not present

## 2018-03-15 DIAGNOSIS — I10 Essential (primary) hypertension: Secondary | ICD-10-CM | POA: Diagnosis not present

## 2018-03-15 DIAGNOSIS — I483 Typical atrial flutter: Secondary | ICD-10-CM

## 2018-03-15 DIAGNOSIS — E785 Hyperlipidemia, unspecified: Secondary | ICD-10-CM | POA: Diagnosis not present

## 2018-03-15 DIAGNOSIS — I110 Hypertensive heart disease with heart failure: Secondary | ICD-10-CM | POA: Diagnosis not present

## 2018-03-15 DIAGNOSIS — I2510 Atherosclerotic heart disease of native coronary artery without angina pectoris: Secondary | ICD-10-CM

## 2018-03-15 DIAGNOSIS — I712 Thoracic aortic aneurysm, without rupture: Secondary | ICD-10-CM | POA: Insufficient documentation

## 2018-03-15 DIAGNOSIS — I080 Rheumatic disorders of both mitral and aortic valves: Secondary | ICD-10-CM | POA: Diagnosis not present

## 2018-03-15 DIAGNOSIS — R001 Bradycardia, unspecified: Secondary | ICD-10-CM

## 2018-03-15 DIAGNOSIS — I447 Left bundle-branch block, unspecified: Secondary | ICD-10-CM

## 2018-03-15 LAB — CUP PACEART INCLINIC DEVICE CHECK
Battery Remaining Longevity: 102 mo
Brady Statistic AP VP Percent: 0.8 %
Brady Statistic AP VS Percent: 0.1 % — CL
Brady Statistic AS VP Percent: 96.1 %
Brady Statistic AS VS Percent: 3 %
Date Time Interrogation Session: 20190503111312
HighPow Impedance: 56 Ohm
Implantable Lead Implant Date: 20190131
Implantable Lead Implant Date: 20190131
Implantable Lead Implant Date: 20190131
Implantable Lead Location: 753858
Implantable Lead Location: 753859
Implantable Lead Location: 753860
Implantable Lead Model: 4598
Implantable Lead Model: 5076
Implantable Lead Model: 6935
Implantable Pulse Generator Implant Date: 20190131
Lead Channel Impedance Value: 399 Ohm
Lead Channel Impedance Value: 418 Ohm
Lead Channel Impedance Value: 456 Ohm
Lead Channel Pacing Threshold Amplitude: 0.5 V
Lead Channel Pacing Threshold Amplitude: 0.5 V
Lead Channel Pacing Threshold Amplitude: 0.75 V
Lead Channel Pacing Threshold Pulse Width: 0.4 ms
Lead Channel Pacing Threshold Pulse Width: 0.4 ms
Lead Channel Pacing Threshold Pulse Width: 0.4 ms
Lead Channel Sensing Intrinsic Amplitude: 1.6 mV
Lead Channel Sensing Intrinsic Amplitude: 15.3 mV
Lead Channel Setting Pacing Amplitude: 2.25 V
Lead Channel Setting Pacing Amplitude: 3.5 V
Lead Channel Setting Pacing Amplitude: 3.5 V
Lead Channel Setting Pacing Pulse Width: 0.4 ms
Lead Channel Setting Pacing Pulse Width: 0.4 ms
Lead Channel Setting Sensing Sensitivity: 0.3 mV

## 2018-03-15 NOTE — Patient Instructions (Signed)
Medication Instructions:  Your physician recommends that you continue on your current medications as directed. Please refer to the Current Medication list given to you today.  Labwork: None ordered.  Testing/Procedures: None ordered.  Follow-Up: Your physician wants you to follow-up in: 9 months with Dr. Ladona Ridgel.   You will receive a reminder letter in the mail two months in advance. If you don't receive a letter, please call our office to schedule the follow-up appointment.  Remote monitoring is used to monitor your ICD from home. This monitoring reduces the number of office visits required to check your device to one time per year. It allows [place] to keep an eye on the functioning of your device to ensure it is working properly. You are scheduled for a device check from home on 06/17/2018. You may send your transmission at any time that day. If you have a wireless device, the transmission will be sent automatically. After your physician reviews your transmission, you will receive a postcard with your next transmission date.  Any Other Special Instructions Will Be Listed Below (If Applicable).  If you need a refill on your cardiac medications before your next appointment, please call your pharmacy.

## 2018-03-15 NOTE — Progress Notes (Signed)
HPI Christopher Mann returns today for ongoing evaluation and management of his ICD. He is a pleasant 84 yo man with LBBB, chronic systolic heart failure, s/p prior valve replacements and severe LV dysfunction who underwent biv ICD insertion about 3 months ago. In the interim, he has done well with no syncope or ICD shock and no chest pain or sob. He remains very active. Allergies  Allergen Reactions  . Bee Venom Anaphylaxis  . Metoprolol     Other reaction(s): Other (See Comments) Bradycardia.  . Aspirin     Other reaction(s): Other (See Comments)  . Simvastatin     Other reaction(s): Other (See Comments) Muscle pain  . Pork-Derived Products      Current Outpatient Medications  Medication Sig Dispense Refill  . acetaminophen (TYLENOL) 500 MG tablet Take 1,000 mg by mouth daily as needed for moderate pain.    . furosemide (LASIX) 20 MG tablet Take 20 mg by mouth 2 (two) times a week. Take Mondays and Thursdays    . losartan (COZAAR) 100 MG tablet Take 1 tablet (100 mg total) by mouth daily. 90 tablet 3  . OVER THE COUNTER MEDICATION Take 3 capsules by mouth daily. OCC dietary supplement    . rosuvastatin (CRESTOR) 5 MG tablet Take 1 tablet by mouth every Monday, Wednesday, and Friday.    . Saw Palmetto 450 MG CAPS Take 1,350 mg by mouth daily.    . sildenafil (REVATIO) 20 MG tablet Take 20 mg by mouth as needed (erectile dysfunction).     Marland Kitchen spironolactone (ALDACTONE) 25 MG tablet Take 1 tablet (25 mg total) by mouth daily. 90 tablet 3  . tamsulosin (FLOMAX) 0.4 MG CAPS capsule Take 0.4 mg by mouth daily as needed (for urinary symptoms).     . warfarin (COUMADIN) 3 MG tablet Take 3-4.5 mg by mouth See admin instructions. Take 3 mg in the morning on Sun/Tue/Wed/Thurs/Sat and 4.5 mg on Mon/Fri     No current facility-administered medications for this visit.      Past Medical History:  Diagnosis Date  . Aortic stenosis    a. s/p redo aortic valve replacement in 1996 with  mechanical prosthesis. On Coumadin  . Atrial flutter (HCC)    a. s/p DCCV in 08/2017  . Bundle branch block, left   . Chronic combined systolic and diastolic CHF (congestive heart failure) (HCC)    a. 2012: Echo showing EF of 25-30% b. 02/2016: Echo with EF of 20-25%, Grade 2 DD, mechanical AV functioning normally, PA Pressure 37 mm Hg  . Coronary artery disease 05/1995   a. s/p PCI with stent placement in 1996.  . Dilated cardiomyopathy (HCC)   . Elevated fasting blood sugar   . Hypercholesterolemia   . Hypertension     ROS:   All systems reviewed and negative except as noted in the HPI.   Past Surgical History:  Procedure Laterality Date  . AORTIC VALVE REPLACEMENT  1996  . BIV ICD INSERTION CRT-D N/A 12/13/2017   Procedure: BIV ICD INSERTION CRT-D;  Surgeon: Marinus Maw, MD;  Location: [facility] INVASIVE CV LAB;  Service: Cardiovascular;  Laterality: N/A;     Family History  Problem Relation Age of Onset  . CAD Father      Social History   Socioeconomic History  . Marital status: Married    Spouse name: Not on file  . Number of children: Not on file  . Years of education: Not on file  .  Highest education level: Not on file  Occupational History  . Not on file  Social Needs  . Financial resource strain: Not on file  . Food insecurity:    Worry: Not on file    Inability: Not on file  . Transportation needs:    Medical: Not on file    Non-medical: Not on file  Tobacco Use  . Smoking status: Former Games developer  . Smokeless tobacco: Never Used  Substance and Sexual Activity  . Alcohol use: No    Alcohol/week: 0.0 oz  . Drug use: No  . Sexual activity: Not on file  Lifestyle  . Physical activity:    Days per week: Not on file    Minutes per session: Not on file  . Stress: Not on file  Relationships  . Social connections:    Talks on phone: Not on file    Gets together: Not on file    Attends religious service: Not on file    Active member of club or  organization: Not on file    Attends meetings of clubs or organizations: Not on file    Relationship status: Not on file  . Intimate partner violence:    Fear of current or ex partner: Not on file    Emotionally abused: Not on file    Physically abused: Not on file    Forced sexual activity: Not on file  Other Topics Concern  . Not on file  Social History Narrative  . Not on file     BP 130/70 (BP Location: Right Arm, Patient Position: Sitting, Cuff Size: Normal)   Pulse (!) 56   Ht 5\' 7"  (1.702 m)   Wt 140 lb 12.8 oz (63.9 kg)   SpO2 98%   BMI 22.05 kg/m   Physical Exam:  Well appearing 84 yo man, NAD HEENT: Unremarkable Neck:  6 cm JVD, no thyromegally Lymphatics:  No adenopathy Back:  No CVA tenderness Lungs:  Clear with no wheezes HEART:  Regular rate rhythm, no murmurs, no rubs, no clicks, mechanical S2. Abd:  soft, positive bowel sounds, no organomegally, no rebound, no guarding Ext:  2 plus pulses, no edema, no cyanosis, no clubbing Skin:  No rashes no nodules Neuro:  CN II through XII intact, motor grossly intact   DEVICE  Normal device function.  See PaceArt for details.   Assess/Plan: 1. Chronic systolic heart failure EF 25% - he is class 1 with regard to his symptoms. Will follow. 2. ICD - his medtronic biv ICD is working normally. Will recheck in several months. 3. PAF - he has gone in and out of atrial fib but only for a minute or two. He is asymptomatic. He is already on systemic anti-coagulation. Will follow.  Leonia Reeves.D.

## 2018-03-25 NOTE — Progress Notes (Signed)
Christopher Mann Date of Birth: 1929/12/31   History of Present Illness: Christopher Mann is seen for follow up CHF and AVR. He has a known history of valvular heart disease. He is status post redo aortic valve replacement in 1996 with a mechanical prosthesis at [facility]. He also has a history of coronary disease with a remote coronary stent. He has a history of dilated cardiomyopathy with ejection fraction of 20-25% by echocardiogram in April 2017. He is on chronic Coumadin therapy. He has a history of HTN and HLD. He was seen in October with atrial flutter with RVR rate 160. He was asymptomatic. He was sent to ED and failed to slow with Adenosine. He then underwent DCCV. Was seen in follow up with EP. Not a candidate for beta blocker due to sinus node dysfunction. Underwent placement of BiV ICD on 12/13/17. Device check 03/15/18 was satisfactory.   On follow up today he is doing very well. He denies any swelling or dyspnea. No chest pain. 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  Weight is at baseline. Energy level is good. No dizziness.   Current Outpatient Medications on File Prior to Visit  Medication Sig Dispense Refill  . acetaminophen (TYLENOL) 500 MG tablet Take 1,000 mg by mouth daily as needed for moderate pain.    . furosemide (LASIX) 20 MG tablet Take 20 mg by mouth 2 (two) times a week. Take Mondays and Thursdays    . losartan (COZAAR) 100 MG tablet Take 1 tablet (100 mg total) by mouth daily. 90 tablet 3  . OVER THE COUNTER MEDICATION Take 3 capsules by mouth daily. OCC dietary supplement    . rosuvastatin (CRESTOR) 5 MG tablet Take 1 tablet by mouth every Monday, Wednesday, and Friday.    . Saw Palmetto 450 MG CAPS Take 1,350 mg by mouth daily.    . sildenafil (REVATIO) 20 MG tablet Take 20 mg by mouth as needed (erectile dysfunction).     Marland Kitchen spironolactone (ALDACTONE) 25 MG tablet Take 1 tablet (25 mg total) by mouth daily. 90 tablet 3  . tamsulosin (FLOMAX) 0.4 MG CAPS  capsule Take 0.4 mg by mouth daily as needed (for urinary symptoms).     . warfarin (COUMADIN) 3 MG tablet Take 3-4.5 mg by mouth See admin instructions. Take 3 mg in the morning on Sun/Tue/Wed/Thurs/Sat and 4.5 mg on Mon/Fri     No current facility-administered medications on file prior to visit.     Allergies  Allergen Reactions  . Bee Venom Anaphylaxis  . Metoprolol     Other reaction(s): Other (See Comments) Bradycardia.  . Aspirin     Other reaction(s): Other (See Comments)  . Simvastatin     Other reaction(s): Other (See Comments) Muscle pain  . Pork-Derived Products     Past Medical History:  Diagnosis Date  . Aortic stenosis    a. s/p redo aortic valve replacement in 1996 with mechanical prosthesis. On Coumadin  . Atrial flutter (HCC)    a. s/p DCCV in 08/2017  . Bundle branch block, left   . Chronic combined systolic and diastolic CHF (congestive heart failure) (HCC)    a. 2012: Echo showing EF of 25-30% b. 02/2016: Echo with EF of 20-25%, Grade 2 DD, mechanical AV functioning normally, PA Pressure 37 mm Hg  . Coronary artery disease 05/1995   a. s/p PCI with stent placement in 1996.  . Dilated cardiomyopathy (HCC)   . Elevated fasting blood sugar   .  Hypercholesterolemia   . Hypertension     Past Surgical History:  Procedure Laterality Date  . AORTIC VALVE REPLACEMENT  1996  . BIV ICD INSERTION CRT-D N/A 12/13/2017   Procedure: BIV ICD INSERTION CRT-D;  Surgeon: Marinus Maw, MD;  Location: [facility] INVASIVE CV LAB;  Service: Cardiovascular;  Laterality: N/A;    Social History   Tobacco Use  Smoking Status Former Smoker  Smokeless Tobacco Never Used    Social History   Substance and Sexual Activity  Alcohol Use No  . Alcohol/week: 0.0 oz    Family History  Problem Relation Age of Onset  . CAD Father     Review of Systems: As noted in history of present illness. All other systems were reviewed and are negative.  Physical Exam: BP 140/64   Pulse  62   Ht 5\' 7"  (1.702 m)   Wt 142 lb 3.2 oz (64.5 kg)   BMI 22.27 kg/m  GENERAL:  Well appearing thin WM in NAD HEENT:  PERRL, EOMI, sclera are clear. Oropharynx is clear. NECK:  No jugular venous distention, carotid upstroke brisk and symmetric, no bruits, no thyromegaly or adenopathy LUNGS:  Clear to auscultation bilaterally CHEST:  Unremarkable, ICD site looks OK.  HEART:  RRR,  PMI not displaced or sustained,good mechanical AV click, no S3, no S4: no clicks, no rubs, no murmurs ABD:  Soft, nontender. BS +, no masses or bruits. No hepatomegaly, no splenomegaly EXT:  2 + pulses throughout, no edema, no cyanosis no clubbing SKIN:  Warm and dry.  No rashes NEURO:  Alert and oriented x 3. Cranial nerves II through XII intact. PSYCH:  Cognitively intact  LABORATORY DATA: Lab Results  Component Value Date   WBC 5.1 12/11/2017   HGB 12.6 (L) 12/11/2017   HCT 36.9 (L) 12/11/2017   PLT 196 12/11/2017   GLUCOSE 85 12/11/2017   CHOL 168 09/18/2017   TRIG 79 09/18/2017   HDL 38 (L) 09/18/2017   LDLDIRECT 109.7 12/11/2013   LDLCALC 114 (H) 09/18/2017   ALT 59 09/06/2017   AST 60 (H) 09/06/2017   NA 140 12/11/2017   K 4.6 12/11/2017   CL 112 (H) 12/11/2017   CREATININE 1.58 (H) 12/11/2017   BUN 35 (H) 12/11/2017   CO2 22 12/11/2017   INR 1.92 12/14/2017   HGBA1C 6.4 (H) 01/15/2015   Labs reviewed from primary care. Findings:  INR on 01/27/16- 2.5 12/27/15: cholesterol 170, triglycerides 310, HDL 31, LDL 62. 08/03/15: CBC normal. BUN 31, creatinine 1.36. Potassium 4.3. Other chemistries OK. TSH. 4.68. Dated 12/22/27: A1c 6.5%. Creatinine 1.42. Cholesterol 170, triglycerides 90, HDL 41, LDL 111. TSH and other chemistries normal. plts 133K. Otherwise CBC normal.  Echo   Study Conclusions  - Left ventricle: The cavity size was moderately dilated. Wall   thickness was increased in a pattern of moderate LVH. Systolic   function was severely reduced. The estimated ejection fraction   was  in the range of 20% to 25%. Features are consistent with a   pseudonormal left ventricular filling pattern, with concomitant   abnormal relaxation and increased filling pressure (grade 2   diastolic dysfunction). - Aortic valve: A mechanical prosthesis was present. There was   trivial regurgitation. - Mitral valve: There was mild regurgitation. - Left atrium: The atrium was severely dilated. - Right atrium: The atrium was mildly dilated. - Pulmonary arteries: Systolic pressure was mildly increased. PA   peak pressure: 36 mm Hg (S).   Assessment /  Plan: 1. Atrial flutter s/p DCCV. Now with BiV ICD.  Maintaining NSR  3. Chronic systolic CHF with dilated cardiomyopathy. Ejection fraction of 20-25%.  Continue treatment with ARB and Aldactone. Now s/p BiV ICD. Can take lasix prn. Echo showed no change in EF post BiV pacing. Now that pacemaker in place we can add beta blocker therapy. Will add Coreg 6.25 mg bid.   3. Status post redo aortic valve replacement with mechanical prosthesis. Functioning well by echocardiogram  3. Hypertension, controlled.  4. Coronary disease with remote coronary stent. Asymptomatic.  5. Hyperlipidemia. on statin. LDL OK.   6. DM last A1c 6.5%.     I will follow up in 6 months.

## 2018-03-28 ENCOUNTER — Ambulatory Visit (INDEPENDENT_AMBULATORY_CARE_PROVIDER_SITE_OTHER): Payer: Medicare Other | Admitting: Cardiology

## 2018-03-28 ENCOUNTER — Encounter: Payer: Self-pay | Admitting: Cardiology

## 2018-03-28 VITALS — BP 140/64 | HR 62 | Ht 67.0 in | Wt 142.2 lb

## 2018-03-28 DIAGNOSIS — I10 Essential (primary) hypertension: Secondary | ICD-10-CM

## 2018-03-28 DIAGNOSIS — Z952 Presence of prosthetic heart valve: Secondary | ICD-10-CM | POA: Diagnosis not present

## 2018-03-28 DIAGNOSIS — I483 Typical atrial flutter: Secondary | ICD-10-CM | POA: Diagnosis not present

## 2018-03-28 DIAGNOSIS — I5022 Chronic systolic (congestive) heart failure: Secondary | ICD-10-CM

## 2018-03-28 DIAGNOSIS — I2510 Atherosclerotic heart disease of native coronary artery without angina pectoris: Secondary | ICD-10-CM | POA: Diagnosis not present

## 2018-03-28 MED ORDER — CARVEDILOL 6.25 MG PO TABS: 6.2500 mg | ORAL_TABLET | Freq: Two times a day (BID) | ORAL | 3 refills | Status: DC

## 2018-03-28 NOTE — Patient Instructions (Addendum)
We will add Coreg (carvedilol) 6.25 mg twice a day  I will see you in 6 months

## 2018-04-10 ENCOUNTER — Telehealth: Payer: Self-pay | Admitting: Internal Medicine

## 2018-04-10 NOTE — Telephone Encounter (Signed)
Pt/wife called  And said they were  A couple of hundred  Laqueta Due  With Mr Siegel having  Irregular heart  Rate  And he  Is stable  And  Might go to nearest ER, [facility]  AND  ASKED TO UPDATE [place]  . Patient  Was  Asymptomatic

## 2018-04-11 ENCOUNTER — Telehealth: Payer: Self-pay

## 2018-04-11 NOTE — Telephone Encounter (Signed)
Spoke with daughter Tresa Endo. Pt at hospital to be admitted for afib? With HR 155. Daughter Tresa Endo doesn't understanding why Pt's ICD is not treating his AFib. Attempted to explain why Pt had ICD placed and that it does not treat afib. Daughter does not seem to understand.  Explained multiple times.  Daughter is upset. Daughter thinks Medtronic rep said Pt has had long episodes of afib not rate controlled in March.  Per review of our device check 03/15/2018 Pt has had less than 0.1 % afib.  Asked medtronic rep to call office. Will discuss with Dr. [place]. Continue to monitor.

## 2018-04-11 NOTE — Telephone Encounter (Signed)
Follow up   Patient's daughter Tresa Endo calling, states patient is in the hospital in [place], [place]. Has questions about  HR 155. Requesting call from nurse Please call spouse.

## 2018-04-16 NOTE — Telephone Encounter (Signed)
Dr. Elvis Coil nurse notified of family call and wanting call back. Dr. [place] and staff to follow up. No action needed by Dr. Ladona Ridgel and this nurse at this time.

## 2018-04-23 ENCOUNTER — Telehealth: Payer: Self-pay

## 2018-04-23 NOTE — Telephone Encounter (Signed)
Spoke to patient's wife last week 04/17/18.She stated husband was admitted to hospital in [facility] recently with Afib.She stated he is back home doing well.He has post hospital follow up there next week.Advised to let daughter Tresa Endo know I tried to call her with no answer.Advised to keep appointment with Dr.Jordan as planned and call sooner if needed.

## 2018-05-15 ENCOUNTER — Telehealth: Payer: Self-pay | Admitting: Physician Assistant

## 2018-05-15 NOTE — Telephone Encounter (Signed)
Received page from answering service that wife Corrie Dandy was trying to reach answering service for "right eye is red/blood in eye not running down, you can just see it." Tried to call pt back on 2 separate occasions and both times I got the message the person I am "trying to reach is unavailable, please try your call again later." I called answering service to make them aware that if patient/wife calls back they may need to provide alternate number.  Dayna Dunn PA-C

## 2018-06-17 ENCOUNTER — Ambulatory Visit (INDEPENDENT_AMBULATORY_CARE_PROVIDER_SITE_OTHER): Payer: Medicare Other | Admitting: *Deleted

## 2018-06-17 DIAGNOSIS — I5022 Chronic systolic (congestive) heart failure: Secondary | ICD-10-CM

## 2018-06-18 NOTE — Progress Notes (Signed)
Remote ICD transmission.   

## 2018-06-25 LAB — CUP PACEART REMOTE DEVICE CHECK
Brady Statistic AP VS Percent: 0.04 %
Brady Statistic AS VP Percent: 1.69 %
Brady Statistic AS VS Percent: 0 %
HIGH POWER IMPEDANCE MEASURED VALUE: 58 Ohm
Implantable Lead Implant Date: 20190131
Implantable Lead Location: 753859
Implantable Lead Location: 753860
Implantable Lead Model: 5076
Implantable Lead Model: 6935
Implantable Pulse Generator Implant Date: 20190131
Lead Channel Impedance Value: 188.1 Ohm
Lead Channel Impedance Value: 189.525
Lead Channel Impedance Value: 361 Ohm
Lead Channel Impedance Value: 399 Ohm
Lead Channel Impedance Value: 399 Ohm
Lead Channel Impedance Value: 608 Ohm
Lead Channel Impedance Value: 608 Ohm
Lead Channel Impedance Value: 646 Ohm
Lead Channel Pacing Threshold Amplitude: 0.5 V
Lead Channel Pacing Threshold Amplitude: 0.5 V
Lead Channel Pacing Threshold Pulse Width: 0.4 ms
Lead Channel Sensing Intrinsic Amplitude: 1.375 mV
Lead Channel Sensing Intrinsic Amplitude: 18.625 mV
Lead Channel Sensing Intrinsic Amplitude: 18.625 mV
Lead Channel Setting Pacing Amplitude: 1 V
Lead Channel Setting Pacing Amplitude: 2.5 V
MDC IDC LEAD IMPLANT DT: 20190131
MDC IDC LEAD IMPLANT DT: 20190131
MDC IDC LEAD LOCATION: 753858
MDC IDC MSMT BATTERY REMAINING LONGEVITY: 92 mo
MDC IDC MSMT BATTERY VOLTAGE: 2.98 V
MDC IDC MSMT LEADCHNL LV IMPEDANCE VALUE: 184.154
MDC IDC MSMT LEADCHNL LV IMPEDANCE VALUE: 193.707
MDC IDC MSMT LEADCHNL LV IMPEDANCE VALUE: 204.14 Ohm
MDC IDC MSMT LEADCHNL LV IMPEDANCE VALUE: 342 Ohm
MDC IDC MSMT LEADCHNL LV IMPEDANCE VALUE: 418 Ohm
MDC IDC MSMT LEADCHNL LV IMPEDANCE VALUE: 456 Ohm
MDC IDC MSMT LEADCHNL LV IMPEDANCE VALUE: 646 Ohm
MDC IDC MSMT LEADCHNL LV IMPEDANCE VALUE: 703 Ohm
MDC IDC MSMT LEADCHNL RA IMPEDANCE VALUE: 418 Ohm
MDC IDC MSMT LEADCHNL RA PACING THRESHOLD AMPLITUDE: 0.625 V
MDC IDC MSMT LEADCHNL RA PACING THRESHOLD PULSEWIDTH: 0.4 ms
MDC IDC MSMT LEADCHNL RA SENSING INTR AMPL: 1.375 mV
MDC IDC MSMT LEADCHNL RV IMPEDANCE VALUE: 475 Ohm
MDC IDC MSMT LEADCHNL RV PACING THRESHOLD PULSEWIDTH: 0.4 ms
MDC IDC SESS DTM: 20190805083723
MDC IDC SET LEADCHNL LV PACING PULSEWIDTH: 0.4 ms
MDC IDC SET LEADCHNL RA PACING AMPLITUDE: 2 V
MDC IDC SET LEADCHNL RV PACING PULSEWIDTH: 0.4 ms
MDC IDC SET LEADCHNL RV SENSING SENSITIVITY: 0.3 mV
MDC IDC STAT BRADY AP VP PERCENT: 98.26 %
MDC IDC STAT BRADY RA PERCENT PACED: 98.26 %
MDC IDC STAT BRADY RV PERCENT PACED: 99.84 %

## 2018-08-14 ENCOUNTER — Telehealth: Payer: Self-pay | Admitting: Cardiology

## 2018-08-14 NOTE — Telephone Encounter (Signed)
° °  [facility] Pre-operative Risk Assessment    Request for surgical clearance:  1. What type of surgery is being performed?  Infection in left arm to be removed  2. When is this surgery scheduled? 08-16-18   3. What type of clearance is required (medical clearance vs. Pharmacy clearance to hold med vs. Both)? Both Is it safe for pt to have surgery?  4. Are there any medications that need to be held prior to surgery and how long? Can pt stop his Coumadin   5. Practice name and name of physician performing surgery? Dr Chevis Pretty  6. What is your office phone number [phone]    7.   What is your office fax number [phone].   Anesthesia type (None, local, MAC, general) ? Not sure   Christopher Mann 08/14/2018, 2:14 PM  _________________________________________________________________   (provider comments below)

## 2018-08-16 NOTE — Telephone Encounter (Signed)
Called pt back regarding surgery date. Surgical clearance request states that surgery is today 08/16/18, however no encounter in Epic. Awaiting call back for confirmation.

## 2018-08-19 NOTE — Telephone Encounter (Signed)
Left message to call back at Dr. Lance Coon office to clarify surgery date.

## 2018-08-19 NOTE — Telephone Encounter (Signed)
Please check with MD office, pt has mechanical aortic valve and there is uncertainty about date of surgery.   Please clarify

## 2018-08-21 NOTE — Telephone Encounter (Signed)
Spoke with Yehuda Mao with Dr. Lance Coon office and she confirmed that pt had his sx 08/16/18 with the recommendations of their office and pts coumadin clinic recommendations. Will close this encounter and delete out of preop.

## 2018-09-16 ENCOUNTER — Ambulatory Visit (INDEPENDENT_AMBULATORY_CARE_PROVIDER_SITE_OTHER): Payer: Medicare Other | Admitting: *Deleted

## 2018-09-16 DIAGNOSIS — I420 Dilated cardiomyopathy: Secondary | ICD-10-CM

## 2018-09-16 DIAGNOSIS — I5022 Chronic systolic (congestive) heart failure: Secondary | ICD-10-CM

## 2018-09-16 NOTE — Progress Notes (Signed)
Remote ICD transmission.   

## 2018-09-19 ENCOUNTER — Encounter: Payer: Self-pay | Admitting: Cardiology

## 2018-11-01 ENCOUNTER — Telehealth: Payer: Self-pay | Admitting: Cardiology

## 2018-11-01 NOTE — Telephone Encounter (Signed)
Spoke w/ Christopher Mann and his wife and confirmed that he wanted his information released to [facility] in [place].

## 2018-11-14 LAB — CUP PACEART REMOTE DEVICE CHECK
Brady Statistic AP VP Percent: 98.91 %
Brady Statistic AP VS Percent: 0.05 %
Brady Statistic RV Percent Paced: 99.8 %
Date Time Interrogation Session: 20191104113525
HIGH POWER IMPEDANCE MEASURED VALUE: 62 Ohm
Implantable Lead Implant Date: 20190131
Implantable Lead Location: 753858
Implantable Lead Location: 753859
Implantable Lead Model: 6935
Implantable Pulse Generator Implant Date: 20190131
Lead Channel Impedance Value: 193.707
Lead Channel Impedance Value: 193.707
Lead Channel Impedance Value: 209 Ohm
Lead Channel Impedance Value: 399 Ohm
Lead Channel Impedance Value: 399 Ohm
Lead Channel Impedance Value: 665 Ohm
Lead Channel Impedance Value: 703 Ohm
Lead Channel Pacing Threshold Amplitude: 0.5 V
Lead Channel Pacing Threshold Amplitude: 0.625 V
Lead Channel Sensing Intrinsic Amplitude: 22.625 mV
Lead Channel Setting Pacing Amplitude: 1.25 V
Lead Channel Setting Pacing Amplitude: 2.5 V
Lead Channel Setting Pacing Pulse Width: 0.4 ms
Lead Channel Setting Sensing Sensitivity: 0.3 mV
MDC IDC LEAD IMPLANT DT: 20190131
MDC IDC LEAD IMPLANT DT: 20190131
MDC IDC LEAD LOCATION: 753860
MDC IDC MSMT BATTERY REMAINING LONGEVITY: 84 mo
MDC IDC MSMT BATTERY VOLTAGE: 2.98 V
MDC IDC MSMT LEADCHNL LV IMPEDANCE VALUE: 204.14 Ohm
MDC IDC MSMT LEADCHNL LV IMPEDANCE VALUE: 204.14 Ohm
MDC IDC MSMT LEADCHNL LV IMPEDANCE VALUE: 361 Ohm
MDC IDC MSMT LEADCHNL LV IMPEDANCE VALUE: 418 Ohm
MDC IDC MSMT LEADCHNL LV IMPEDANCE VALUE: 418 Ohm
MDC IDC MSMT LEADCHNL LV IMPEDANCE VALUE: 475 Ohm
MDC IDC MSMT LEADCHNL LV IMPEDANCE VALUE: 665 Ohm
MDC IDC MSMT LEADCHNL LV IMPEDANCE VALUE: 703 Ohm
MDC IDC MSMT LEADCHNL LV IMPEDANCE VALUE: 722 Ohm
MDC IDC MSMT LEADCHNL LV PACING THRESHOLD AMPLITUDE: 0.75 V
MDC IDC MSMT LEADCHNL LV PACING THRESHOLD PULSEWIDTH: 0.4 ms
MDC IDC MSMT LEADCHNL RA IMPEDANCE VALUE: 456 Ohm
MDC IDC MSMT LEADCHNL RA PACING THRESHOLD PULSEWIDTH: 0.4 ms
MDC IDC MSMT LEADCHNL RA SENSING INTR AMPL: 1 mV
MDC IDC MSMT LEADCHNL RA SENSING INTR AMPL: 1 mV
MDC IDC MSMT LEADCHNL RV IMPEDANCE VALUE: 513 Ohm
MDC IDC MSMT LEADCHNL RV PACING THRESHOLD PULSEWIDTH: 0.4 ms
MDC IDC MSMT LEADCHNL RV SENSING INTR AMPL: 22.625 mV
MDC IDC SET LEADCHNL RA PACING AMPLITUDE: 2 V
MDC IDC SET LEADCHNL RV PACING PULSEWIDTH: 0.4 ms
MDC IDC STAT BRADY AS VP PERCENT: 1.03 %
MDC IDC STAT BRADY AS VS PERCENT: 0 %
MDC IDC STAT BRADY RA PERCENT PACED: 98.92 %

## 2019-09-02 ENCOUNTER — Telehealth: Payer: Self-pay | Admitting: Cardiology

## 2019-09-02 NOTE — Telephone Encounter (Signed)
Spoke to pt and pt wife who stated they no longer see Dr. Martinique and will call if they need to.

## 2020-05-14 ENCOUNTER — Emergency Department (HOSPITAL_BASED_OUTPATIENT_CLINIC_OR_DEPARTMENT_OTHER): Payer: Medicare Other

## 2020-05-14 ENCOUNTER — Encounter (HOSPITAL_BASED_OUTPATIENT_CLINIC_OR_DEPARTMENT_OTHER): Payer: Self-pay

## 2020-05-14 ENCOUNTER — Inpatient Hospital Stay (HOSPITAL_BASED_OUTPATIENT_CLINIC_OR_DEPARTMENT_OTHER)
Admission: EM | Admit: 2020-05-14 | Discharge: 2020-05-17 | DRG: 193 | Disposition: A | Discharge status: Home / Self Care | Payer: Medicare Other | Attending: Internal Medicine | Admitting: Internal Medicine

## 2020-05-14 ENCOUNTER — Other Ambulatory Visit: Payer: Self-pay

## 2020-05-14 DIAGNOSIS — I350 Nonrheumatic aortic (valve) stenosis: Secondary | ICD-10-CM | POA: Diagnosis present

## 2020-05-14 DIAGNOSIS — Z886 Allergy status to analgesic agent status: Secondary | ICD-10-CM | POA: Diagnosis not present

## 2020-05-14 DIAGNOSIS — E875 Hyperkalemia: Secondary | ICD-10-CM | POA: Diagnosis present

## 2020-05-14 DIAGNOSIS — Z87891 Personal history of nicotine dependence: Secondary | ICD-10-CM

## 2020-05-14 DIAGNOSIS — J189 Pneumonia, unspecified organism: Principal | ICD-10-CM | POA: Diagnosis present

## 2020-05-14 DIAGNOSIS — N183 Chronic kidney disease, stage 3 unspecified: Secondary | ICD-10-CM | POA: Diagnosis present

## 2020-05-14 DIAGNOSIS — G9349 Other encephalopathy: Secondary | ICD-10-CM | POA: Diagnosis present

## 2020-05-14 DIAGNOSIS — N179 Acute kidney failure, unspecified: Secondary | ICD-10-CM | POA: Diagnosis present

## 2020-05-14 DIAGNOSIS — Z955 Presence of coronary angioplasty implant and graft: Secondary | ICD-10-CM

## 2020-05-14 DIAGNOSIS — I10 Essential (primary) hypertension: Secondary | ICD-10-CM | POA: Diagnosis present

## 2020-05-14 DIAGNOSIS — I5043 Acute on chronic combined systolic (congestive) and diastolic (congestive) heart failure: Secondary | ICD-10-CM | POA: Diagnosis present

## 2020-05-14 DIAGNOSIS — Z952 Presence of prosthetic heart valve: Secondary | ICD-10-CM

## 2020-05-14 DIAGNOSIS — Z79899 Other long term (current) drug therapy: Secondary | ICD-10-CM

## 2020-05-14 DIAGNOSIS — I2510 Atherosclerotic heart disease of native coronary artery without angina pectoris: Secondary | ICD-10-CM | POA: Diagnosis present

## 2020-05-14 DIAGNOSIS — E1122 Type 2 diabetes mellitus with diabetic chronic kidney disease: Secondary | ICD-10-CM | POA: Diagnosis present

## 2020-05-14 DIAGNOSIS — Z7901 Long term (current) use of anticoagulants: Secondary | ICD-10-CM

## 2020-05-14 DIAGNOSIS — N1832 Chronic kidney disease, stage 3b: Secondary | ICD-10-CM | POA: Diagnosis present

## 2020-05-14 DIAGNOSIS — I480 Paroxysmal atrial fibrillation: Secondary | ICD-10-CM | POA: Diagnosis present

## 2020-05-14 DIAGNOSIS — E872 Acidosis: Secondary | ICD-10-CM | POA: Diagnosis present

## 2020-05-14 DIAGNOSIS — I130 Hypertensive heart and chronic kidney disease with heart failure and stage 1 through stage 4 chronic kidney disease, or unspecified chronic kidney disease: Secondary | ICD-10-CM | POA: Diagnosis present

## 2020-05-14 DIAGNOSIS — Z20822 Contact with and (suspected) exposure to covid-19: Secondary | ICD-10-CM | POA: Diagnosis present

## 2020-05-14 DIAGNOSIS — I77810 Thoracic aortic ectasia: Secondary | ICD-10-CM | POA: Diagnosis present

## 2020-05-14 DIAGNOSIS — R0602 Shortness of breath: Secondary | ICD-10-CM | POA: Diagnosis present

## 2020-05-14 DIAGNOSIS — Z95810 Presence of automatic (implantable) cardiac defibrillator: Secondary | ICD-10-CM | POA: Diagnosis not present

## 2020-05-14 DIAGNOSIS — E039 Hypothyroidism, unspecified: Secondary | ICD-10-CM | POA: Insufficient documentation

## 2020-05-14 DIAGNOSIS — I4892 Unspecified atrial flutter: Secondary | ICD-10-CM | POA: Diagnosis present

## 2020-05-14 DIAGNOSIS — Z79890 Hormone replacement therapy (postmenopausal): Secondary | ICD-10-CM

## 2020-05-14 DIAGNOSIS — G9340 Encephalopathy, unspecified: Secondary | ICD-10-CM | POA: Diagnosis not present

## 2020-05-14 DIAGNOSIS — I420 Dilated cardiomyopathy: Secondary | ICD-10-CM | POA: Diagnosis present

## 2020-05-14 DIAGNOSIS — Z91018 Allergy to other foods: Secondary | ICD-10-CM

## 2020-05-14 DIAGNOSIS — Z8249 Family history of ischemic heart disease and other diseases of the circulatory system: Secondary | ICD-10-CM

## 2020-05-14 DIAGNOSIS — E785 Hyperlipidemia, unspecified: Secondary | ICD-10-CM | POA: Diagnosis present

## 2020-05-14 DIAGNOSIS — I447 Left bundle-branch block, unspecified: Secondary | ICD-10-CM | POA: Diagnosis present

## 2020-05-14 DIAGNOSIS — R410 Disorientation, unspecified: Secondary | ICD-10-CM

## 2020-05-14 DIAGNOSIS — I509 Heart failure, unspecified: Secondary | ICD-10-CM

## 2020-05-14 DIAGNOSIS — Z888 Allergy status to other drugs, medicaments and biological substances status: Secondary | ICD-10-CM

## 2020-05-14 DIAGNOSIS — Z91030 Bee allergy status: Secondary | ICD-10-CM

## 2020-05-14 LAB — COMPREHENSIVE METABOLIC PANEL
ALT: 29 U/L (ref 0–44)
AST: 29 U/L (ref 15–41)
Albumin: 3.8 g/dL (ref 3.5–5.0)
Alkaline Phosphatase: 49 U/L (ref 38–126)
Anion gap: 13 (ref 5–15)
BUN: 54 mg/dL — ABNORMAL HIGH (ref 8–23)
CO2: 19 mmol/L — ABNORMAL LOW (ref 22–32)
Calcium: 9.2 mg/dL (ref 8.9–10.3)
Chloride: 112 mmol/L — ABNORMAL HIGH (ref 98–111)
Creatinine, Ser: 2.35 mg/dL — ABNORMAL HIGH (ref 0.61–1.24)
GFR calc Af Amer: 28 mL/min — ABNORMAL LOW (ref >60)
GFR calc non Af Amer: 24 mL/min — ABNORMAL LOW (ref >60)
Glucose, Bld: 178 mg/dL — ABNORMAL HIGH (ref 70–99)
Potassium: 5.8 mmol/L — ABNORMAL HIGH (ref 3.5–5.1)
Sodium: 144 mmol/L (ref 135–145)
Total Bilirubin: 1.1 mg/dL (ref 0.3–1.2)
Total Protein: 6.5 g/dL (ref 6.5–8.1)

## 2020-05-14 LAB — GLUCOSE, CAPILLARY: Glucose-Capillary: 112 mg/dL — ABNORMAL HIGH (ref 70–99)

## 2020-05-14 LAB — CBC WITH DIFFERENTIAL/PLATELET
Abs Immature Granulocytes: 0.06 10*3/uL (ref 0.00–0.07)
Basophils Absolute: 0 10*3/uL (ref 0.0–0.1)
Basophils Relative: 0 %
Eosinophils Absolute: 0 10*3/uL (ref 0.0–0.5)
Eosinophils Relative: 0 %
HCT: 36.6 % — ABNORMAL LOW (ref 39.0–52.0)
Hemoglobin: 11.7 g/dL — ABNORMAL LOW (ref 13.0–17.0)
Immature Granulocytes: 1 %
Lymphocytes Relative: 3 %
Lymphs Abs: 0.3 10*3/uL — ABNORMAL LOW (ref 0.7–4.0)
MCH: 31.7 pg (ref 26.0–34.0)
MCHC: 32 g/dL (ref 30.0–36.0)
MCV: 99.2 fL (ref 80.0–100.0)
Monocytes Absolute: 0.7 10*3/uL (ref 0.1–1.0)
Monocytes Relative: 6 %
Neutro Abs: 10.9 10*3/uL — ABNORMAL HIGH (ref 1.7–7.7)
Neutrophils Relative %: 90 %
Platelets: 182 10*3/uL (ref 150–400)
RBC: 3.69 MIL/uL — ABNORMAL LOW (ref 4.22–5.81)
RDW: 14.8 % (ref 11.5–15.5)
WBC: 12 10*3/uL — ABNORMAL HIGH (ref 4.0–10.5)
nRBC: 0 % (ref 0.0–0.2)

## 2020-05-14 LAB — LACTIC ACID, PLASMA: Lactic Acid, Venous: 1.2 mmol/L (ref 0.5–1.9)

## 2020-05-14 LAB — URINALYSIS, MICROSCOPIC (REFLEX)

## 2020-05-14 LAB — URINALYSIS, ROUTINE W REFLEX MICROSCOPIC
Bilirubin Urine: NEGATIVE
Glucose, UA: NEGATIVE mg/dL
Ketones, ur: NEGATIVE mg/dL
Leukocytes,Ua: NEGATIVE
Nitrite: NEGATIVE
Protein, ur: NEGATIVE mg/dL
Specific Gravity, Urine: 1.02 (ref 1.005–1.030)
pH: 5 (ref 5.0–8.0)

## 2020-05-14 LAB — PROCALCITONIN: Procalcitonin: 7.35 ng/mL

## 2020-05-14 LAB — PROTIME-INR
INR: 3.2 — ABNORMAL HIGH (ref 0.8–1.2)
Prothrombin Time: 31.4 seconds — ABNORMAL HIGH (ref 11.4–15.2)

## 2020-05-14 LAB — MRSA PCR SCREENING: MRSA by PCR: NEGATIVE

## 2020-05-14 LAB — BRAIN NATRIURETIC PEPTIDE: B Natriuretic Peptide: 1999.4 pg/mL — ABNORMAL HIGH (ref 0.0–100.0)

## 2020-05-14 LAB — SARS CORONAVIRUS 2 BY RT PCR (HOSPITAL ORDER, PERFORMED IN ~~LOC~~ HOSPITAL LAB): SARS Coronavirus 2: NEGATIVE

## 2020-05-14 MED ORDER — TAMSULOSIN HCL 0.4 MG PO CAPS: 0.4000 mg | ORAL_CAPSULE | Freq: Every day | ORAL | Status: DC | PRN

## 2020-05-14 MED ORDER — LOSARTAN POTASSIUM 50 MG PO TABS
100.0000 mg | ORAL_TABLET | Freq: Every day | ORAL | Status: DC
  Administered 2020-05-14: 100 mg via ORAL
  Filled 2020-05-14: qty 4

## 2020-05-14 MED ORDER — AMIODARONE HCL 200 MG PO TABS
200.0000 mg | ORAL_TABLET | Freq: Every day | ORAL | Status: DC
  Filled 2020-05-14 (×2): qty 1

## 2020-05-14 MED ORDER — SPIRONOLACTONE 25 MG PO TABS
25.0000 mg | ORAL_TABLET | ORAL | Status: DC
  Filled 2020-05-14 (×2): qty 1

## 2020-05-14 MED ORDER — CARVEDILOL 12.5 MG PO TABS: 25.0000 mg | ORAL_TABLET | Freq: Two times a day (BID) | ORAL | Status: DC

## 2020-05-14 MED ORDER — ROSUVASTATIN CALCIUM 10 MG PO TABS
5.0000 mg | ORAL_TABLET | ORAL | Status: DC
  Filled 2020-05-14 (×2): qty 1

## 2020-05-14 MED ORDER — ACETAMINOPHEN 325 MG PO TABS: 650.0000 mg | ORAL_TABLET | ORAL | Status: DC | PRN

## 2020-05-14 MED ORDER — SODIUM CHLORIDE 0.9 % IV SOLN
INTRAVENOUS | Status: DC | PRN
  Administered 2020-05-14: 500 mL via INTRAVENOUS

## 2020-05-14 MED ORDER — SAW PALMETTO 450 MG PO CAPS: 1350.0000 mg | ORAL_CAPSULE | Freq: Every day | ORAL | Status: DC

## 2020-05-14 MED ORDER — SODIUM CHLORIDE 0.9 % IV SOLN
500.0000 mg | Freq: Every day | INTRAVENOUS | Status: DC
  Administered 2020-05-14: 500 mg via INTRAVENOUS
  Filled 2020-05-14: qty 500

## 2020-05-14 MED ORDER — SODIUM CHLORIDE 0.9% FLUSH
3.0000 mL | Freq: Two times a day (BID) | INTRAVENOUS | Status: DC
  Administered 2020-05-14 – 2020-05-17 (×6): 3 mL via INTRAVENOUS

## 2020-05-14 MED ORDER — VANCOMYCIN HCL IN DEXTROSE 1-5 GM/200ML-% IV SOLN: 1000.0000 mg | INTRAVENOUS | Status: DC

## 2020-05-14 MED ORDER — SODIUM CHLORIDE 0.9 % IV SOLN: 250.0000 mL | INTRAVENOUS | Status: DC | PRN

## 2020-05-14 MED ORDER — CHLORHEXIDINE GLUCONATE CLOTH 2 % EX PADS
6.0000 | MEDICATED_PAD | Freq: Every day | CUTANEOUS | Status: DC
  Administered 2020-05-14 – 2020-05-15 (×2): 6 via TOPICAL

## 2020-05-14 MED ORDER — METRONIDAZOLE IN NACL 5-0.79 MG/ML-% IV SOLN
500.0000 mg | Freq: Once | INTRAVENOUS | Status: AC
  Administered 2020-05-14: 500 mg via INTRAVENOUS
  Filled 2020-05-14: qty 100

## 2020-05-14 MED ORDER — INSULIN ASPART 100 UNIT/ML ~~LOC~~ SOLN
0.0000 [IU] | Freq: Three times a day (TID) | SUBCUTANEOUS | Status: DC
  Administered 2020-05-15 – 2020-05-16 (×2): 2 [IU] via SUBCUTANEOUS

## 2020-05-14 MED ORDER — FUROSEMIDE 20 MG PO TABS: 20.0000 mg | ORAL_TABLET | ORAL | Status: DC

## 2020-05-14 MED ORDER — ACETAMINOPHEN 325 MG PO TABS
650.0000 mg | ORAL_TABLET | Freq: Once | ORAL | Status: AC
  Administered 2020-05-14: 650 mg via ORAL
  Filled 2020-05-14: qty 2

## 2020-05-14 MED ORDER — VANCOMYCIN HCL IN DEXTROSE 1-5 GM/200ML-% IV SOLN
1000.0000 mg | Freq: Once | INTRAVENOUS | Status: AC
  Administered 2020-05-14: 1000 mg via INTRAVENOUS
  Filled 2020-05-14: qty 200

## 2020-05-14 MED ORDER — SODIUM CHLORIDE 0.9 % IV SOLN: 2.0000 g | INTRAVENOUS | Status: DC

## 2020-05-14 MED ORDER — FUROSEMIDE 10 MG/ML IJ SOLN
40.0000 mg | Freq: Two times a day (BID) | INTRAMUSCULAR | Status: DC
  Administered 2020-05-15: 40 mg via INTRAVENOUS
  Filled 2020-05-14 (×3): qty 4

## 2020-05-14 MED ORDER — ONDANSETRON HCL 4 MG/2ML IJ SOLN: 4.0000 mg | Freq: Four times a day (QID) | INTRAMUSCULAR | Status: DC | PRN

## 2020-05-14 MED ORDER — ACETAMINOPHEN 500 MG PO TABS: 1000.0000 mg | ORAL_TABLET | Freq: Every day | ORAL | Status: DC | PRN

## 2020-05-14 MED ORDER — SODIUM CHLORIDE 0.9% FLUSH: 3.0000 mL | INTRAVENOUS | Status: DC | PRN

## 2020-05-14 MED ORDER — FUROSEMIDE 10 MG/ML IJ SOLN
40.0000 mg | Freq: Once | INTRAMUSCULAR | Status: AC
  Administered 2020-05-14: 40 mg via INTRAVENOUS
  Filled 2020-05-14: qty 4

## 2020-05-14 MED ORDER — LEVOTHYROXINE SODIUM 50 MCG PO TABS: 50.0000 ug | ORAL_TABLET | Freq: Every morning | ORAL | Status: DC

## 2020-05-14 MED ORDER — SODIUM CHLORIDE 0.9 % IV SOLN
2.0000 g | Freq: Once | INTRAVENOUS | Status: AC
  Administered 2020-05-14: 2 g via INTRAVENOUS
  Filled 2020-05-14: qty 2

## 2020-05-14 MED ORDER — SODIUM CHLORIDE 0.9 % IV SOLN
2.0000 g | Freq: Every day | INTRAVENOUS | Status: DC
  Administered 2020-05-14 – 2020-05-15 (×2): 2 g via INTRAVENOUS
  Filled 2020-05-14: qty 20
  Filled 2020-05-14 (×2): qty 2

## 2020-05-14 NOTE — ED Notes (Signed)
Report given to [facility] with [facility]

## 2020-05-14 NOTE — ED Notes (Signed)
All valuables given to daughter, except jeans, watch and eye glasses, cellphone

## 2020-05-14 NOTE — ED Notes (Signed)
Pt aware of need for urine specimen collection

## 2020-05-14 NOTE — Progress Notes (Signed)
Pharmacy Antibiotic Note  Christopher Mann is a 86 y.o. male admitted on 05/14/2020 with infection of unknown source.  Pharmacy has been consulted for vancomycin/cefepime dosing. Tmax/24h 100.9, WBC 12, LA 1.2. AKI - SCr 2.35 on admit.  Plan: Cefepime 2g IV x 1; then 2g IV q24h Vancomycin 1g IV x1; then Vancomycin 1 mg IV Q48h Flagyl 500mg  IV x 1 per EDP - f/u if to continue Monitor clinical progress, c/s, renal function F/u de-escalation plan/LOT, vancomycin levels as indicated   Height: 5\' 5"  (165.1 cm) Weight: 65.6 kg (144 lb 11.2 oz) IBW/kg (Calculated) : 61.5  Temp (24hrs), Avg:100 F (37.8 C), Min:99 F (37.2 C), Max:100.9 F (38.3 C)  Recent Labs  Lab 05/14/20 0737  WBC 12.0*  CREATININE 2.35*  LATICACIDVEN 1.2    Estimated Creatinine Clearance: 19.3 mL/min (A) (by C-G formula based on SCr of 2.35 mg/dL (H)).    Allergies  Allergen Reactions  . Bee Venom Anaphylaxis  . Metoprolol     Other reaction(s): Other (See Comments) Bradycardia.  . Aspirin     Other reaction(s): Other (See Comments)  . Simvastatin     Other reaction(s): Other (See Comments) Muscle pain  . Pork-Derived Products     Antimicrobials this admission: 7/2 vancomycin >>  7/2 cefepime >>  7/2 flagyl x 1  Dose adjustments this admission:   Microbiology results:   9/2, PharmD, BCPS Please check AMION for all [facility] contact numbers Clinical Pharmacist 05/14/2020 9:41 AM

## 2020-05-14 NOTE — ED Notes (Signed)
Report given to recieiving nurse at [facility] RN

## 2020-05-14 NOTE — ED Notes (Signed)
Patient transported to CT 

## 2020-05-14 NOTE — ED Notes (Signed)
Pt provided meal.  Awaiting bed placement

## 2020-05-14 NOTE — ED Provider Notes (Signed)
[facility] EMERGENCY DEPARTMENT Provider Note   CSN: [id number] Arrival date & time: 05/14/20  4098     History Chief Complaint  Patient presents with  . Hemoptysis    Christopher Mann is a 86 y.o. male.  Patient is an 86 year old male with a history of CHF with an EF of 20 to 25% status post pacemaker, CAD, aortic stenosis status post valve repair in 1996, hypertension, chronic kidney disease who is presenting today with his daughter for change in mental status, hemoptysis and cough.  Daughter reports that his wife reported that last night he started to have bloody frothy sputum.  Patient reports that he had increased shortness of breath today with only mild activity and felt short of breath even after tying his shoes which is unusual.  He is also had worsening swelling in his lower legs bilaterally which his daughter states is more than normal.  Patient did have a temperature of 99 at home.  He denies any chest pain, abdominal pain, vomiting, diarrhea or urinary symptoms.  However daughter reports on the drive over here he was talking about things that did not make any sense he does not know what day it is and he has not been himself.  Patient is usually A&O x3.  Patient did see his doctor on Monday and had new medication changes.  INR was 1.9 at that time.  The history is provided by the patient and a relative.       Past Medical History:  Diagnosis Date  . Aortic stenosis    a. s/p redo aortic valve replacement in 1996 with mechanical prosthesis. On Coumadin  . Atrial flutter (HCC)    a. s/p DCCV in 08/2017  . Bundle branch block, left   . Chronic combined systolic and diastolic CHF (congestive heart failure) (HCC)    a. 2012: Echo showing EF of 25-30% b. 02/2016: Echo with EF of 20-25%, Grade 2 DD, mechanical AV functioning normally, PA Pressure 37 mm Hg  . Coronary artery disease 05/1995   a. s/p PCI with stent placement in 1996.  . Dilated cardiomyopathy (HCC)   .  Elevated fasting blood sugar   . Hypercholesterolemia   . Hypertension     Patient Active Problem List   Diagnosis Date Noted  . ICD (implantable cardioverter-defibrillator) in place 12/13/2017  . Elevated fasting blood sugar 07/01/2014  . S/P AVR 07/25/2012  . Severe aortic valve stenosis   . Bundle branch block, left   . Hypertension   . Dilated cardiomyopathy (HCC)   . Hypercholesterolemia   . Coronary artery disease 05/14/1995    Past Surgical History:  Procedure Laterality Date  . AORTIC VALVE REPLACEMENT  1996  . BIV ICD INSERTION CRT-D N/A 12/13/2017   Procedure: BIV ICD INSERTION CRT-D;  Surgeon: Marinus Maw, MD;  Location: [facility] INVASIVE CV LAB;  Service: Cardiovascular;  Laterality: N/A;       Family History  Problem Relation Age of Onset  . CAD Father     Social History   Tobacco Use  . Smoking status: Former Games developer  . Smokeless tobacco: Never Used  Substance Use Topics  . Alcohol use: No    Alcohol/week: 0.0 standard drinks  . Drug use: No    Home Medications Prior to Admission medications   Medication Sig Start Date End Date Taking? Authorizing Provider  acetaminophen (TYLENOL) 500 MG tablet Take 1,000 mg by mouth daily as needed for moderate pain.    [provider]  carvedilol (COREG) 6.25 MG tablet Take 1 tablet (6.25 mg total) by mouth 2 (two) times daily. 03/28/18   [place], Peter M, MD  furosemide (LASIX) 20 MG tablet Take 20 mg by mouth 2 (two) times a week. Take Mondays and Thursdays    [provider]  losartan (COZAAR) 100 MG tablet Take 1 tablet (100 mg total) by mouth daily. 07/01/14   [place], Peter M, MD  OVER THE COUNTER MEDICATION Take 3 capsules by mouth daily. OCC dietary supplement    [provider]  rosuvastatin (CRESTOR) 5 MG tablet Take 1 tablet by mouth every Monday, Wednesday, and Friday. 08/14/17   [provider]  Saw Palmetto 450 MG CAPS Take 1,350 mg by mouth daily.    [provider]   sildenafil (REVATIO) 20 MG tablet Take 20 mg by mouth as needed (erectile dysfunction).  10/15/17   [provider]  spironolactone (ALDACTONE) 25 MG tablet Take 1 tablet (25 mg total) by mouth daily. 07/01/14   [place], Peter M, MD  tamsulosin (FLOMAX) 0.4 MG CAPS capsule Take 0.4 mg by mouth daily as needed (for urinary symptoms).     [provider]  warfarin (COUMADIN) 3 MG tablet Take 3-4.5 mg by mouth See admin instructions. Take 3 mg in the morning on Sun/Tue/Wed/Thurs/Sat and 4.5 mg on Mon/Fri    [provider]    Allergies    Bee venom, Metoprolol, Aspirin, Simvastatin, and Pork-derived products  Review of Systems   Review of Systems  All other systems reviewed and are negative.   Physical Exam Updated Vital Signs BP 111/75 Comment: rm air  Pulse 60 Comment: rm air  Temp (!) 100.9 F (38.3 C) (Rectal)   Resp (!) 29 Comment: rm air  Ht 5\' 5"  (1.651 m)   Wt 65.6 kg   SpO2 94% Comment: rm air  BMI 24.08 kg/m   Physical Exam Vitals and nursing note reviewed.  Constitutional:      General: He is not in acute distress.    Appearance: Normal appearance. He is well-developed and normal weight.  HENT:     Head: Normocephalic and atraumatic.     Nose: Nose normal.     Mouth/Throat:     Comments: Dried blood on the lips Eyes:     Conjunctiva/sclera: Conjunctivae normal.     Pupils: Pupils are equal, round, and reactive to light.  Cardiovascular:     Rate and Rhythm: Normal rate and regular rhythm.     Pulses: Normal pulses.     Heart sounds: No murmur heard.   Pulmonary:     Effort: Pulmonary effort is normal. No respiratory distress.     Breath sounds: Examination of the right-lower field reveals rales. Examination of the left-lower field reveals rales. Rales present. No wheezing.  Abdominal:     General: There is no distension.     Palpations: Abdomen is soft.     Tenderness: There is no abdominal tenderness. There is no right CVA  tenderness, left CVA tenderness, guarding or rebound.  Musculoskeletal:        General: No tenderness. Normal range of motion.     Cervical back: Normal range of motion and neck supple.     Right lower leg: Edema present.     Left lower leg: Edema present.  Skin:    General: Skin is warm and dry.     Findings: No erythema or rash.  Neurological:     Mental Status:  He is alert.     Cranial Nerves: No cranial nerve deficit.     Sensory: No sensory deficit.     Motor: No weakness.     Coordination: Coordination normal.     Comments: Oriented to person and place but confused about time  Psychiatric:     Comments: Calm and cooperative     ED Results / Procedures / Treatments   Labs (all labs ordered are listed, but only abnormal results are displayed) Labs Reviewed  CBC WITH DIFFERENTIAL/PLATELET - Abnormal; Notable for the following components:      Result Value   WBC 12.0 (*)    RBC 3.69 (*)    Hemoglobin 11.7 (*)    HCT 36.6 (*)    Neutro Abs 10.9 (*)    Lymphs Abs 0.3 (*)    All other components within normal limits  COMPREHENSIVE METABOLIC PANEL - Abnormal; Notable for the following components:   Potassium 5.8 (*)    Chloride 112 (*)    CO2 19 (*)    Glucose, Bld 178 (*)    BUN 54 (*)    Creatinine, Ser 2.35 (*)    GFR calc non Af Amer 24 (*)    GFR calc Af Amer 28 (*)    All other components within normal limits  PROTIME-INR - Abnormal; Notable for the following components:   Prothrombin Time 31.4 (*)    INR 3.2 (*)    All other components within normal limits  BRAIN NATRIURETIC PEPTIDE - Abnormal; Notable for the following components:   B Natriuretic Peptide 1,999.4 (*)    All other components within normal limits  URINALYSIS, ROUTINE W REFLEX MICROSCOPIC - Abnormal; Notable for the following components:   Hgb urine dipstick TRACE (*)    All other components within normal limits  URINALYSIS, MICROSCOPIC (REFLEX) - Abnormal; Notable for the following components:    Bacteria, UA FEW (*)    All other components within normal limits  SARS CORONAVIRUS 2 BY RT PCR (HOSPITAL ORDER, PERFORMED IN [facility])  URINE CULTURE  LACTIC ACID, PLASMA    EKG EKG Interpretation  Date/Time:  Friday May 14 2020 07:46:28 EDT Ventricular Rate:  60 PR Interval:    QRS Duration: 180 QT Interval:  508 QTC Calculation: 508 R Axis:   -67 Text Interpretation: VENTRICULAR PACED RHYTHM Left bundle branch block Baseline wander in lead(s) V3 No significant change since last tracing Confirmed by Gwyneth Sprout ([id number]) on 05/14/2020 9:04:19 AM   Radiology CT ABDOMEN PELVIS WO CONTRAST  Result Date: 05/14/2020 CLINICAL DATA:  Fevers with right-sided flank pain, initial encounter EXAM: CT ABDOMEN AND PELVIS WITHOUT CONTRAST TECHNIQUE: Multidetector CT imaging of the abdomen and pelvis was performed following the standard protocol without IV contrast. COMPARISON:  None. FINDINGS: Lower chest: Bilateral lower lobe infiltrates are seen which may represent a component of aspiration. Associated small effusions are noted bilaterally. Hepatobiliary: Scattered hepatic cysts are noted. The gallbladder is within normal limits. Pancreas: Unremarkable. No pancreatic ductal dilatation or surrounding inflammatory changes. Spleen: Normal in size without focal abnormality. Adrenals/Urinary Tract: Adrenal glands are unremarkable. No renal calculi or obstructive changes are noted. There is a chronic changes as described above without acute abnormality. Right parapelvic cyst measuring 3.8 cm. No obstructive changes are seen. The bladder is well distended. Stomach/Bowel: Scattered diverticular change of the colon is noted without evidence of diverticulitis. No obstructive or inflammatory changes are seen. The appendix is not well visualized. No inflammatory changes to  suggest appendicitis are noted. Small bowel and stomach appear within normal limits. Vascular/Lymphatic: Aortic  atherosclerosis. No enlarged abdominal or pelvic lymph nodes. Reproductive: Prostate is unremarkable. Other: No abdominal wall hernia or abnormality. No abdominopelvic ascites. Musculoskeletal: Degenerative changes of lumbar spine are seen. Mild anterolisthesis of L4 on L5 is noted of a degenerative nature. IMPRESSION: Bilateral lower lobe infiltrates and effusions consistent with multifocal pneumonia possibly related to aspiration. Electronically Signed   By: Alcide Clever M.D.   On: 05/14/2020 09:45   DG Chest Port 1 View  Result Date: 05/14/2020 CLINICAL DATA:  Hemoptysis. EXAM: PORTABLE CHEST 1 VIEW COMPARISON:  December 14, 2017. FINDINGS: Stable cardiomegaly. Status post aortic valve repair. Left-sided pacemaker is unchanged in position. No pneumothorax is noted. Mild bibasilar atelectasis is noted. Small right pleural effusion is noted. Bony thorax is unremarkable. IMPRESSION: Mild bibasilar subsegmental atelectasis. Small right pleural effusion. Aortic Atherosclerosis (ICD10-I70.0). Electronically Signed   By: Lupita Raider M.D.   On: 05/14/2020 08:10    Procedures Procedures (including critical care time)  Medications Ordered in ED Medications - No data to display  ED Course  I have reviewed the triage vital signs and the nursing notes.  Pertinent labs & imaging results that were available during my care of the patient were reviewed by me and considered in my medical decision making (see chart for details).    MDM Rules/Calculators/A&P                          Elderly male with multiple medical problems presenting today with hemoptysis, altered mental status and low-grade temperature of 99.  Concern for CHF exacerbation versus worsening renal failure versus infectious process.  Pictures of hemoptysis at his home appears to be blood-tinged sputum but no frank clot.  He does not appear to have epistaxis causing his symptoms.  Patient does appear fluid overloaded on exam with rales and  distal edema.  Labs, x-ray and Covid pending.  Patient does have symptoms more consistent with delirium but is not displaying signs of meningitis or stroke.  9:19 AM Patient's rectal temperature here is 100.9.  He remains resting comfortably and denies any shortness of breath at rest.  Work of breathing has improved with rest.  Patient continues to complain of some right side pain but has no reproducible flank or upper abdominal pain on my exam.  Patient's labs are consistent with a mild leukocytosis of 12 with stable hemoglobin of 11, UA without signs for infection but trace hemoglobin, INR 3.2, CMP with new AKI with a creatinine of 2.35 from prior baseline of 1.8 on 07/03/2019 and BNP is almost 2000.  Covid is negative and patient has been fully vaccinated, chest x-ray shows bibasilar atelectasis with right pleural effusion.  Patient's delirium has worsened per daughter who is sitting with him in the room.  Based on patient's vital signs, blood work and exam concern for CHF exacerbation with some hepatorenal syndrome in addition to new infection.  At this time there is no clear-cut source for the infection but with the temperature of 100.9 and delirium patient was given Tylenol.  Given the new report of side pain we will do a CT to evaluate for retained stones or gallbladder pathology.  Patient had blood cultures drawn and broad-spectrum antibiotics were given.  Patient's lactic acid at this time is 1.2 and given obvious signs for fluid overload he was not given additional fluid at this time.  Do  feel that patient will require hospitalization.  If CT shows no other pathology will also give IV Lasix.  10:21 AM Ct positive for bilateral lower lobe infiltrates and effusions consistent with multifocal pneumonia possibly related to aspiration.  Patient has already been covered with antibiotics.  He was given IV Lasix as well.  Spoke with Dr. Lucianne Muss who will admit the patient to stepdown.  CRITICAL CARE Performed  by: Amauri Keefe Total critical care time: 30 minutes Critical care time was exclusive of separately billable procedures and treating other patients. Critical care was necessary to treat or prevent imminent or life-threatening deterioration. Critical care was time spent personally by me on the following activities: development of treatment plan with patient and/or surrogate as well as nursing, discussions with consultants, evaluation of patient's response to treatment, examination of patient, obtaining history from patient or surrogate, ordering and performing treatments and interventions, ordering and review of laboratory studies, ordering and review of radiographic studies, pulse oximetry and re-evaluation of patient's condition.   Final Clinical Impression(s) / ED Diagnoses Final diagnoses:  Community acquired pneumonia, unspecified laterality  Delirium  AKI (acute kidney injury) (HCC)  Acute on chronic congestive heart failure, unspecified heart failure type [facility])    Rx / DC Orders ED Discharge Orders    None       Gwyneth Sprout, MD 05/14/20 1037

## 2020-05-14 NOTE — ED Notes (Signed)
Called to provide report to unit, was told bed placement cancelled assignment to 1241.  Family made aware.

## 2020-05-14 NOTE — Progress Notes (Signed)
PHARMACIST - PHYSICIAN ORDER COMMUNICATION  CONCERNING: P&T Medication Policy on Herbal Medications  DESCRIPTION:  This patient's order for:  Saw Palmetto  has been noted.  This product(s) is classified as an "herbal" or natural product. Due to a lack of definitive safety studies or FDA approval, nonstandard manufacturing practices, plus the potential risk of unknown drug-drug interactions while on inpatient medications, the Pharmacy and Therapeutics Committee does not permit the use of "herbal" or natural products of this type within [facility].   ACTION TAKEN: The pharmacy department is unable to verify this order at this time and your patient has been informed of this safety policy. Please reevaluate patient's clinical condition at discharge and address if the herbal or natural product(s) should be resumed at that time.   

## 2020-05-14 NOTE — H&P (Signed)
History and Physical    Christopher Mann:[id number] DOB: 07/02/1930 DOA: 05/14/2020  PCP: [name], MD   Patient coming from: Home   Chief Complaint: SOB, cough, pink sputum, increased leg swelling   HPI: Christopher Mann is a 86 y.o. male with medical history significant for diabetes mellitus, hyperlipidemia, coronary artery disease, atrial flutter on warfarin, and cardiomyopathy with EF 25%, now presenting to the emergency department with shortness of breath, increased leg swelling, and cough.  Patient reports that he had been doing fairly well recently but has noticed increased bilateral lower leg swelling and then increased shortness of breath yesterday.  He becomes more short of breath when he bends over or lays on his back recently.  He has had a cough with mainly clear sputum but some pink or blood-tinged sputum as well since last night.  He denies any chest pain associated with this.  Denies any leg tenderness.  He denies melena or hematochezia.  Patient's daughter had reported that the patient seemed to be confused earlier.  He is able to identify his location, name and birthdate, and current month and year by time of admission and denies any headache, change in vision or hearing, or focal numbness or weakness.  He has not felt chills or fevers. Denies unintentional weight loss or night sweats.   [facility] ED Course: Upon arrival to the ED, patient is found to be febrile to 38.3 C, saturating low to mid 90s on room air, tachypneic to the low 30s, and with stable blood pressure.  EKG features junctional rhythm with chronic LBBB.  Chest x-ray with suspected small pleural effusion.  CT of the abdomen and pelvis is most notable for bilateral lower lobe infiltrates and effusions concerning for multifocal pneumonia, possibly aspiration.  CBC features a leukocytosis to 12,000.  Lactic acid is reassuringly normal.  INR is 3.2.  BNP is 2000.  Urinalysis unremarkable.  Chemistry  panel with potassium 5.8, bicarbonate 17, BUN 54, and creatinine 2.35, up from 1.86 in August 2020.  Blood and urine cultures were collected and the patient was treated with acetaminophen, cefepime, vancomycin, and 40 mg IV Lasix in the ED.    Review of Systems:  All other systems reviewed and apart from HPI, are negative.  Past Medical History:  Diagnosis Date  . Aortic stenosis    a. s/p redo aortic valve replacement in 1996 with mechanical prosthesis. On Coumadin  . Atrial flutter (HCC)    a. s/p DCCV in 08/2017  . Bundle branch block, left   . Chronic combined systolic and diastolic CHF (congestive heart failure) (HCC)    a. 2012: Echo showing EF of 25-30% b. 02/2016: Echo with EF of 20-25%, Grade 2 DD, mechanical AV functioning normally, PA Pressure 37 mm Hg  . Coronary artery disease 05/1995   a. s/p PCI with stent placement in 1996.  . Dilated cardiomyopathy (HCC)   . Elevated fasting blood sugar   . Hypercholesterolemia   . Hypertension     Past Surgical History:  Procedure Laterality Date  . AORTIC VALVE REPLACEMENT  1996  . BIV ICD INSERTION CRT-D N/A 12/13/2017   Procedure: BIV ICD INSERTION CRT-D;  Surgeon: Marinus Maw, MD;  Location: [facility] INVASIVE CV LAB;  Service: Cardiovascular;  Laterality: N/A;     reports that he has quit smoking. He has never used smokeless tobacco. He reports that he does not drink alcohol and does not use drugs.  Allergies  Allergen  Reactions  . Bee Venom Anaphylaxis  . Metoprolol     Other reaction(s): Other (See Comments) Bradycardia.  . Aspirin     Other reaction(s): Other (See Comments)  . Simvastatin     Other reaction(s): Other (See Comments) Muscle pain  . Pork-Derived Products     Family History  Problem Relation Age of Onset  . CAD Father      Prior to Admission medications   Medication Sig Start Date End Date Taking? Authorizing Provider  acetaminophen (TYLENOL) 500 MG tablet Take 1,000 mg by mouth daily as needed  for moderate pain.   Yes [provider]  amiodarone (PACERONE) 200 MG tablet Take 200 mg by mouth. 04/12/18  Yes [provider]  carvedilol (COREG) 6.25 MG tablet Take 1 tablet (6.25 mg total) by mouth 2 (two) times daily. 03/28/18  Yes [place], Peter M, MD  furosemide (LASIX) 20 MG tablet Take 20 mg by mouth See admin instructions. Takes once a week on Fridays   Yes [provider]  levothyroxine (SYNTHROID) 50 MCG tablet Take 50 mcg by mouth every morning. 03/12/20  Yes [provider]  losartan (COZAAR) 100 MG tablet Take 1 tablet (100 mg total) by mouth daily. 07/01/14  Yes [place], Peter M, MD  rosuvastatin (CRESTOR) 5 MG tablet Take 5 mg by mouth daily.  08/14/17  Yes [provider]  Saw Palmetto 450 MG CAPS Take 1,350 mg by mouth every Monday, Wednesday, and Friday.    Yes [provider]  spironolactone (ALDACTONE) 25 MG tablet Take 1 tablet (25 mg total) by mouth daily. Patient taking differently: Take 25 mg by mouth every other day.  07/01/14  Yes [place], Peter M, MD  tamsulosin (FLOMAX) 0.4 MG CAPS capsule Take 0.4 mg by mouth daily as needed (for urinary symptoms).    Yes [provider]  warfarin (COUMADIN) 3 MG tablet Take 3 mg by mouth See admin instructions. Take 1.5mg  by mouth daily except 3mg  on Friday.   Yes [provider]  OVER THE COUNTER MEDICATION Take 3 capsules by mouth daily. OCC dietary supplement    [provider]    Physical Exam: Vitals:   05/14/20 1940 05/14/20 2004 05/14/20 2106 05/14/20 2109  BP:  (!) 127/53  (!) 145/52  Pulse: (!) 58 66  79  Resp: 17 (!) 26  (!) 31  Temp:   98.4 F (36.9 C)   TempSrc:   Oral   SpO2: 95% 96%  96%  Weight:      Height:        Constitutional: NAD, calm  Eyes: PERTLA, lids and conjunctivae normal ENMT: Mucous membranes are moist. Posterior pharynx clear of any exudate or lesions.   Neck: normal, supple, no masses, no thyromegaly Respiratory:  Mildly tachypneic but speaking full sentances, no wheezing. No pallor or cyanosis.  Cardiovascular: S1 & S2 heard, regular rate and rhythm. Pitting edema to lower legs bilaterally.   Abdomen: No distension, no tenderness, soft. Bowel sounds active.  Musculoskeletal: no clubbing / cyanosis. No joint deformity upper and lower extremities.   Skin: no significant rashes, lesions, ulcers. Warm, dry, well-perfused. Neurologic: CN 2-12 grossly intact. Sensation intact. Strength 5/5 in all 4 limbs.  Psychiatric: Alert and oriented to person, place, and situation. Very pleasant and cooperative.    Labs and Imaging on Admission: I have personally reviewed following labs and imaging studies  CBC: Recent Labs  Lab 05/14/20 0737  WBC 12.0*  NEUTROABS 10.9*  HGB  11.7*  HCT 36.6*  MCV 99.2  PLT 182   Basic Metabolic Panel: Recent Labs  Lab 05/14/20 0737  NA 144  K 5.8*  CL 112*  CO2 19*  GLUCOSE 178*  BUN 54*  CREATININE 2.35*  CALCIUM 9.2   GFR: Estimated Creatinine Clearance: 19.3 mL/min (A) (by C-G formula based on SCr of 2.35 mg/dL (H)). Liver Function Tests: Recent Labs  Lab 05/14/20 0737  AST 29  ALT 29  ALKPHOS 49  BILITOT 1.1  PROT 6.5  ALBUMIN 3.8   No results for input(s): LIPASE, AMYLASE in the last 168 hours. No results for input(s): AMMONIA in the last 168 hours. Coagulation Profile: Recent Labs  Lab 05/14/20 0737  INR 3.2*   Cardiac Enzymes: No results for input(s): CKTOTAL, CKMB, CKMBINDEX, TROPONINI in the last 168 hours. BNP (last 3 results) No results for input(s): PROBNP in the last 8760 hours. HbA1C: No results for input(s): HGBA1C in the last 72 hours. CBG: No results for input(s): GLUCAP in the last 168 hours. Lipid Profile: No results for input(s): CHOL, HDL, LDLCALC, TRIG, CHOLHDL, LDLDIRECT in the last 72 hours. Thyroid Function Tests: No results for input(s): TSH, T4TOTAL, FREET4, T3FREE, THYROIDAB in the last 72 hours. Anemia Panel: No  results for input(s): VITAMINB12, FOLATE, FERRITIN, TIBC, IRON, RETICCTPCT in the last 72 hours. Urine analysis:    Component Value Date/Time   COLORURINE YELLOW 05/14/2020 0832   APPEARANCEUR CLEAR 05/14/2020 0832   LABSPEC 1.020 05/14/2020 0832   PHURINE 5.0 05/14/2020 0832   GLUCOSEU NEGATIVE 05/14/2020 0832   HGBUR TRACE (A) 05/14/2020 0832   BILIRUBINUR NEGATIVE 05/14/2020 0832   KETONESUR NEGATIVE 05/14/2020 0832   PROTEINUR NEGATIVE 05/14/2020 0832   NITRITE NEGATIVE 05/14/2020 0832   LEUKOCYTESUR NEGATIVE 05/14/2020 0832   Sepsis Labs: @LABRCNTIP (procalcitonin:4,lacticidven:4) ) Recent Results (from the past 240 hour(s))  SARS Coronavirus 2 by RT PCR (hospital order, performed in [facility] hospital lab) Nasopharyngeal Nasopharyngeal Swab     Status: None   Collection Time: 05/14/20  7:37 AM   Specimen: Nasopharyngeal Swab  Result Value Ref Range Status   SARS Coronavirus 2 NEGATIVE NEGATIVE Final    Comment: (NOTE) SARS-CoV-2 target nucleic acids are NOT DETECTED.  The SARS-CoV-2 RNA is generally detectable in upper and lower respiratory specimens during the acute phase of infection. The lowest concentration of SARS-CoV-2 viral copies this assay can detect is 250 copies / mL. A negative result does not preclude SARS-CoV-2 infection and should not be used as the sole basis for treatment or other patient management decisions.  A negative result may occur with improper specimen collection / handling, submission of specimen other than nasopharyngeal swab, presence of viral mutation(s) within the areas targeted by this assay, and inadequate number of viral copies (<250 copies / mL). A negative result must be combined with clinical observations, patient history, and epidemiological information.  Fact Sheet for Patients:   07/15/20  Fact Sheet for Healthcare Providers: BoilerBrush.com.cy  This test is not yet  approved or  cleared by the https://pope.com/ FDA and has been authorized for detection and/or diagnosis of SARS-CoV-2 by FDA under an Emergency Use Authorization (EUA).  This EUA will remain in effect (meaning this test can be used) for the duration of the COVID-19 declaration under Section 564(b)(1) of the Act, 21 U.S.C. section 360bbb-3(b)(1), unless the authorization is terminated or revoked sooner.  Performed at [facility], [address], [place], [place]  Radiological Exams on Admission: CT ABDOMEN PELVIS WO CONTRAST  Result Date: 05/14/2020 CLINICAL DATA:  Fevers with right-sided flank pain, initial encounter EXAM: CT ABDOMEN AND PELVIS WITHOUT CONTRAST TECHNIQUE: Multidetector CT imaging of the abdomen and pelvis was performed following the standard protocol without IV contrast. COMPARISON:  None. FINDINGS: Lower chest: Bilateral lower lobe infiltrates are seen which may represent a component of aspiration. Associated small effusions are noted bilaterally. Hepatobiliary: Scattered hepatic cysts are noted. The gallbladder is within normal limits. Pancreas: Unremarkable. No pancreatic ductal dilatation or surrounding inflammatory changes. Spleen: Normal in size without focal abnormality. Adrenals/Urinary Tract: Adrenal glands are unremarkable. No renal calculi or obstructive changes are noted. There is a chronic changes as described above without acute abnormality. Right parapelvic cyst measuring 3.8 cm. No obstructive changes are seen. The bladder is well distended. Stomach/Bowel: Scattered diverticular change of the colon is noted without evidence of diverticulitis. No obstructive or inflammatory changes are seen. The appendix is not well visualized. No inflammatory changes to suggest appendicitis are noted. Small bowel and stomach appear within normal limits. Vascular/Lymphatic: Aortic atherosclerosis. No enlarged abdominal or pelvic lymph nodes. Reproductive: Prostate  is unremarkable. Other: No abdominal wall hernia or abnormality. No abdominopelvic ascites. Musculoskeletal: Degenerative changes of lumbar spine are seen. Mild anterolisthesis of L4 on L5 is noted of a degenerative nature. IMPRESSION: Bilateral lower lobe infiltrates and effusions consistent with multifocal pneumonia possibly related to aspiration. Electronically Signed   By: Alcide Clever M.D.   On: 05/14/2020 09:45   DG Chest Port 1 View  Result Date: 05/14/2020 CLINICAL DATA:  Hemoptysis. EXAM: PORTABLE CHEST 1 VIEW COMPARISON:  December 14, 2017. FINDINGS: Stable cardiomegaly. Status post aortic valve repair. Left-sided pacemaker is unchanged in position. No pneumothorax is noted. Mild bibasilar atelectasis is noted. Small right pleural effusion is noted. Bony thorax is unremarkable. IMPRESSION: Mild bibasilar subsegmental atelectasis. Small right pleural effusion. Aortic Atherosclerosis (ICD10-I70.0). Electronically Signed   By: Lupita Raider M.D.   On: 05/14/2020 08:10    EKG: Independently reviewed. Junctional rhythm, chronic LBBB.   Assessment/Plan  1. Pneumonia  - Presents with SOB, b/l leg swelling, orthopnea, and cough with pink sputum and is found to have fever and leukocytosis with imaging findings suggestive of multifocal pneumonia, possible from aspiration  - COVID pcr is negative  - Blood cultures were collected in ED and he was treated with vancomycin and cefepime  - Culture sputum, check strep pneumo and legionella antigens, trend procalcitonin, keep NPO pending SLP eval, and continue antibiotic coverage with Rocephin and azithromycin    2. Acute on chronic combined systolic & diastolic CHF  - Presents with increased SOB, increased b/l leg swelling, orthopnea, and pink & blood-tinged sputum  - Echo from May 2019 with EF 20-25%, grade II diastolic dysfunction  - He was given Lasix 40 mg IV in ED  - Continue diuresis with Lasix 40 mg IV q12h, follow daily wt and I/Os, monitor  renal function and electrolytes    3. Renal insufficiency; hyperkalemia  - SCr is 2.35 in ED with potassium of 5.8; SCr was 1.86 in August 2021 and acuity of worsening unclear  - Hold ACE-i and Aldactone, continue cardiac monitoring, renally-dose medications, continue Lasix, repeat chem panel in am   4. Atrial fibrillation  - In junctional rhythm in ED  - CHADS-VASc at least 5 (age x2, CHF, DM, CAD)  - INR is 3.2 in ED, patient reports some pink and blood-tinged sputum, will hold warfarin for  now, monitor for hemoptysis, and repeat INR in am    5. CAD - No anginal complaints  - Had been on statin, plan to continue pending pharmacy medication-reconciliation    6. Acute encephalopathy  - Patient was confused today per family, is alert and oriented to person, place, and month & year by time of admission  - No focal neuro deficits  - Likely secondary to the acute illness, will monitor, use delirium precautions    7. Hypothyroidism  - Plan to continue Synthroid pending pharmacy medication-reconciliation   8. Blood tinged sputum  - Patient reports some pink and blood-tinged sputum since night of 05/13/20  - INR is 3.2, platelets normal, likely related to acute CHF and/or PNA  - Treat PNA and CHF as above, monitor, repeat INR and CBC in am   9. Type II DM  - A1c was 6.3% in August 2020 - Check CBGs and use a low-intensity SSI     DVT prophylaxis: warfarin pta  Code Status: Full  Family Communication: Discussed with patient  Disposition Plan:  Patient is from: Home  Anticipated d/c is to: TBD Anticipated d/c date is: 05/16/20 Patient currently: Requiring treatment of pneumonia in hospital due to high PORT score  Consults called: None  Admission status: Inpatient     Briscoe Deutscher, MD [facility] Pager: See [email or web address]  If 7AM-7PM, please contact the daytime attending [email or web address]  05/14/2020, 10:10 PM

## 2020-05-14 NOTE — Care Plan (Signed)
Patient is an 86 year old male with a history of CHF with an EF of 20 to 25% status post pacemaker, CAD, aortic stenosis status post valve repair in 1996, hypertension, chronic kidney disease who presents in the ER with AMS and pink colored phlegm.History is obtained from daughter,  Daughter reports  last night he started to have bloody frothy sputum.  Patient reports that he had increased shortness of breath today with only mild activity and felt short of breath even with minimal activity. He is also had worsening swelling in his lower legs bilaterally which his daughter states is more than normal.  Patient was found febrile in the ED temperature 100.9, CT abdomen shows bilateral lower lobe infiltrate and effusion consistent with multifocal pneumonia possibly secondary to aspiration,  patient with altered mental status, BNP 2000, Lasix in the ED, patient is being admitted for multifocal pneumonia possibly secondary to aspiration altered mental status could be multifactorial and fluid overload with acute kidney injury.   Please consult cardiology and nephrology after patient arrives.

## 2020-05-14 NOTE — ED Triage Notes (Signed)
Pt coughing up blood intermittently since last night, worse this morning, takes blood thinner, hx chf renal failure

## 2020-05-14 NOTE — ED Notes (Signed)
Assisted to bathroom

## 2020-05-15 ENCOUNTER — Inpatient Hospital Stay (HOSPITAL_COMMUNITY): Payer: Medicare Other

## 2020-05-15 DIAGNOSIS — I10 Essential (primary) hypertension: Secondary | ICD-10-CM

## 2020-05-15 DIAGNOSIS — I5043 Acute on chronic combined systolic (congestive) and diastolic (congestive) heart failure: Secondary | ICD-10-CM

## 2020-05-15 LAB — GLUCOSE, CAPILLARY
Glucose-Capillary: 89 mg/dL (ref 70–99)
Glucose-Capillary: 171 mg/dL — ABNORMAL HIGH (ref 70–99)
Glucose-Capillary: 115 mg/dL — ABNORMAL HIGH (ref 70–99)
Glucose-Capillary: 117 mg/dL — ABNORMAL HIGH (ref 70–99)

## 2020-05-15 LAB — ECHOCARDIOGRAM COMPLETE
Height: 65 in
Weight: 2197.55 oz

## 2020-05-15 LAB — BASIC METABOLIC PANEL
Anion gap: 8 (ref 5–15)
BUN: 56 mg/dL — ABNORMAL HIGH (ref 8–23)
CO2: 21 mmol/L — ABNORMAL LOW (ref 22–32)
Calcium: 8.3 mg/dL — ABNORMAL LOW (ref 8.9–10.3)
Chloride: 111 mmol/L (ref 98–111)
Creatinine, Ser: 2.37 mg/dL — ABNORMAL HIGH (ref 0.61–1.24)
GFR calc Af Amer: 28 mL/min — ABNORMAL LOW (ref >60)
GFR calc non Af Amer: 24 mL/min — ABNORMAL LOW (ref >60)
Glucose, Bld: 106 mg/dL — ABNORMAL HIGH (ref 70–99)
Potassium: 5 mmol/L (ref 3.5–5.1)
Sodium: 140 mmol/L (ref 135–145)

## 2020-05-15 LAB — URINE CULTURE: Culture: <10000 — AB

## 2020-05-15 LAB — PROTIME-INR
INR: 3.2 — ABNORMAL HIGH (ref 0.8–1.2)
Prothrombin Time: 31.9 seconds — ABNORMAL HIGH (ref 11.4–15.2)

## 2020-05-15 LAB — PROCALCITONIN: Procalcitonin: 6.78 ng/mL

## 2020-05-15 LAB — HEMOGLOBIN A1C
Hgb A1c MFr Bld: 6.3 % — ABNORMAL HIGH (ref 4.8–5.6)
Mean Plasma Glucose: 134.11 mg/dL

## 2020-05-15 LAB — STREP PNEUMONIAE URINARY ANTIGEN: Strep Pneumo Urinary Antigen: NEGATIVE

## 2020-05-15 MED ORDER — ORAL CARE MOUTH RINSE
15.0000 mL | Freq: Two times a day (BID) | OROMUCOSAL | Status: DC
  Administered 2020-05-15 – 2020-05-17 (×5): 15 mL via OROMUCOSAL

## 2020-05-15 MED ORDER — GUAIFENESIN-DM 100-10 MG/5ML PO SYRP
5.0000 mL | ORAL_SOLUTION | ORAL | Status: DC | PRN
  Administered 2020-05-15: 5 mL via ORAL
  Filled 2020-05-15: qty 10

## 2020-05-15 MED ORDER — PERFLUTREN LIPID MICROSPHERE
1.0000 mL | INTRAVENOUS | Status: AC | PRN
  Administered 2020-05-15: 2 mL via INTRAVENOUS
  Filled 2020-05-15: qty 10

## 2020-05-15 MED ORDER — AZITHROMYCIN 250 MG PO TABS
500.0000 mg | ORAL_TABLET | Freq: Every day | ORAL | Status: DC
  Administered 2020-05-15: 500 mg via ORAL
  Filled 2020-05-15: qty 2

## 2020-05-15 MED ORDER — LEVOTHYROXINE SODIUM 50 MCG PO TABS
50.0000 ug | ORAL_TABLET | Freq: Every day | ORAL | Status: DC
  Administered 2020-05-16 – 2020-05-17 (×2): 50 ug via ORAL
  Filled 2020-05-15 (×2): qty 1

## 2020-05-15 NOTE — Progress Notes (Addendum)
PROGRESS NOTE    Christopher Mann  JSE:[id number] DOB: [birth year] DOA: 05/14/2020 PCP: [name], MD    Brief Narrative:  Patient was admitted to the hospital with a working diagnosis of community-acquired pneumonia complicated with acute on chronic systolic heart failure decompensation.   86 year old male with past medical history for type 2 diabetes mellitus, dyslipidemia, coronary artery disease, atrial fibrillation and systolic heart failure who presents with dyspnea, cough, and lower extremity edema.  Patient reported about 48 hours worsening lower extremity edema, associated with productive cough and orthopnea.  On his initial physical examination he was febrile 38.3 C, oxygen saturation 90% on room air, tachypneic 30 breaths/min, blood pressure 127/53, heart rate 58-79, on lung auscultation he had no wheezing or rhonchi, heart S1-S2 present and rhythmic, abdomen soft, positive pitting bilateral lower extremity edema.  Sodium 144, potassium 5.8, chloride 112, bicarb 19, glucose 178, BUN 54, creatinine 2.39, white count 12.0, hemoglobin 9.7, hematocrit 36.6, platelets 182.  SARS COVID-19 negative.  Chest radiograph, positive cardiomegaly, right lower lobe interstitial infiltrate.  CT of the abdomen with bilateral lower lobe infiltrates.  EKG 60 bpm, normal axis, left bundle branch block, corrected QTc 0.44, ventricular paced rhythm, no significant ST segment or T wave changes.  Assessment & Plan:   Principal Problem:   Multifocal pneumonia Active Problems:   Coronary artery disease   Hypertension   Atrial flutter (HCC)   Acute renal failure superimposed on stage 3 chronic kidney disease (HCC)   Acute on chronic combined systolic and diastolic CHF (congestive heart failure) (HCC)   Hyperkalemia   1. Multifocal, bilateral lower lobes community acquired pneumonia (present on admission). Patient with improved symptoms but continue to have productive cough with hemoptysis. His  oxygenation is 97% on room air. Procalcitonin is elevated.   Continue antibiotic therapy with IV ceftriaxone and will change to oral azithromycin. Continue oxymetry monitoring, follow on cultures, cell clunt, temperature curve and urinary antigens for legionella and streptococcus.   2. Acute decompensation of chronic systolic heart failure. Patient continue to have rales at bases and pitting lower extremity edema, his dyspnea is improving but not yet back to baseline. Blood pressure is 124 systolic and HR 58.  Continue diuresis with IV furosemide to target a negative fluid balance, continue blood pressure and renal function monitoring. Strict in and out. Will continue to hold afterload reducing agents and B blocker to hypotension.   Follow on repeat echocardiogram.   3. T2DM controlled with Hgb A1c of 6,3.. Continue glucose cover and monitoring with insulin sliding scale, will advance diet.   Follow with swallow evaluation.   4. AKI on CKD stage 3b with hyperkalemia and non gap metabolic acidosis. Old records personally reviewed, his base cr is 1,5. His renal function today has a serum cr of 2,37 with K at 5,0 and serum bicarbonate at 21. Urine output over last 24 is 1,600.   His K is down from 5,8 on admission. Will continue furosemide for hypervolemia. Patient has good urine output close to 2 liters since admission. Follow up renal function in am.   5. Paroxysmal atrial fibrillation/ flutter. Patient on paced rhythm and rate controlled. Continue anticoagulation with warfarin per pharmacy protocol. INR today is 3,2.   Patient continue to be at high risk for worsening heart failure and pneumonia.   Status is: Inpatient  Remains inpatient appropriate because:IV treatments appropriate due to intensity of illness or inability to take PO   Dispo: The patient is from: Home  Anticipated d/c is to: Home              Anticipated d/c date is: 2 days              Patient currently is  not medically stable to d/c.  DVT prophylaxis: Enoxaparin   Code Status:   full  Family Communication:  I spoke with patient's daughter and wife at the bedside, we talked in detail about patient's condition, plan of care and prognosis and all questions were addressed.    Antimicrobials:   Ceftriaxone and azithromycin     Subjective: Patient is feeling better but not yet back to baseline, continue to have lower extremity edema, no chest pain or dyspnea,   Objective: Vitals:   05/15/20 0400 05/15/20 0500 05/15/20 0614 05/15/20 0700  BP: (!) 94/36 (!) 117/37 (!) 117/47 (!) 124/50  Pulse: (!) 58 60 (!) 59 (!) 59  Resp:  (!) 23 (!) 21 20  Temp:      TempSrc:      SpO2: 92% 95% 96% 96%  Weight:      Height:        Intake/Output Summary (Last 24 hours) at 05/15/2020 0802 Last data filed at 05/15/2020 0725 Gross per 24 hour  Intake 821.77 ml  Output 1600 ml  Net -778.23 ml   Filed Weights   05/14/20 0726 05/14/20 2139  Weight: 65.6 kg 62.3 kg    Examination:   General: Not in pain or dyspnea, deconditioned  Neurology: Awake and alert, non focal  E ENT: mild pallor, no icterus, oral mucosa moist Cardiovascular: No JVD. S1-S2 present, rhythmic, no gallops, rubs, or murmurs. +/++ pitting bilateral  lower extremity edema. Pulmonary: positive breath sounds bilaterally, adequate air movement, no wheezing, or rhonchi but bilateral rales at bases Gastrointestinal. Abdomen with , no organomegaly, non tender, no rebound or guarding Skin. No rashes Musculoskeletal: no joint deformities     Data Reviewed: I have personally reviewed following labs and imaging studies  CBC: Recent Labs  Lab 05/14/20 0737  WBC 12.0*  NEUTROABS 10.9*  HGB 11.7*  HCT 36.6*  MCV 99.2  PLT 182   Basic Metabolic Panel: Recent Labs  Lab 05/14/20 0737 05/15/20 0246  NA 144 140  K 5.8* 5.0  CL 112* 111  CO2 19* 21*  GLUCOSE 178* 106*  BUN 54* 56*  CREATININE 2.35* 2.37*  CALCIUM 9.2 8.3*     GFR: Estimated Creatinine Clearance: 19.1 mL/min (A) (by C-G formula based on SCr of 2.37 mg/dL (H)). Liver Function Tests: Recent Labs  Lab 05/14/20 0737  AST 29  ALT 29  ALKPHOS 49  BILITOT 1.1  PROT 6.5  ALBUMIN 3.8   No results for input(s): LIPASE, AMYLASE in the last 168 hours. No results for input(s): AMMONIA in the last 168 hours. Coagulation Profile: Recent Labs  Lab 05/14/20 0737 05/15/20 0246  INR 3.2* 3.2*   Cardiac Enzymes: No results for input(s): CKTOTAL, CKMB, CKMBINDEX, TROPONINI in the last 168 hours. BNP (last 3 results) No results for input(s): PROBNP in the last 8760 hours. HbA1C: Recent Labs    05/15/20 0246  HGBA1C 6.3*   CBG: Recent Labs  Lab 05/14/20 2329 05/15/20 0753  GLUCAP 112* 89   Lipid Profile: No results for input(s): CHOL, HDL, LDLCALC, TRIG, CHOLHDL, LDLDIRECT in the last 72 hours. Thyroid Function Tests: No results for input(s): TSH, T4TOTAL, FREET4, T3FREE, THYROIDAB in the last 72 hours. Anemia Panel: No results for input(s): VITAMINB12, FOLATE, FERRITIN,  TIBC, IRON, RETICCTPCT in the last 72 hours.    Radiology Studies: I have reviewed all of the imaging during this hospital visit personally     Scheduled Meds: . Chlorhexidine Gluconate Cloth  6 each Topical Daily  . furosemide  40 mg Intravenous BID  . insulin aspart  0-9 Units Subcutaneous TID WC  . sodium chloride flush  3 mL Intravenous Q12H   Continuous Infusions: . sodium chloride Stopped (05/14/20 1518)  . sodium chloride    . azithromycin Stopped (05/14/20 2321)  . cefTRIAXone (ROCEPHIN)  IV Stopped (05/15/20 0026)     LOS: 1 day        Zekiah Coen Annett Gula, MD

## 2020-05-15 NOTE — Evaluation (Signed)
Clinical/Bedside Swallow Evaluation Patient Details  Name: Christopher Mann MRN: [id number] Date of Birth: [birth year]  Today's Date: 05/15/2020 Time: SLP Start Time (ACUTE ONLY): 1505 SLP Stop Time (ACUTE ONLY): 1515 SLP Time Calculation (min) (ACUTE ONLY): 10 min  Past Medical History:  Past Medical History:  Diagnosis Date  . Aortic stenosis    a. s/p redo aortic valve replacement in 1996 with mechanical prosthesis. On Coumadin  . Atrial flutter (HCC)    a. s/p DCCV in 08/2017  . Bundle branch block, left   . Chronic combined systolic and diastolic CHF (congestive heart failure) (HCC)    a. 2012: Echo showing EF of 25-30% b. 02/2016: Echo with EF of 20-25%, Grade 2 DD, mechanical AV functioning normally, PA Pressure 37 mm Hg  . Coronary artery disease 05/1995   a. s/p PCI with stent placement in 1996.  . Dilated cardiomyopathy (HCC)   . Elevated fasting blood sugar   . Hypercholesterolemia   . Hypertension    Past Surgical History:  Past Surgical History:  Procedure Laterality Date  . AORTIC VALVE REPLACEMENT  1996  . BIV ICD INSERTION CRT-D N/A 12/13/2017   Procedure: BIV ICD INSERTION CRT-D;  Surgeon: Marinus Maw, MD;  Location: [facility] IN[facility]IVE [facility];  Service: Cardiovascular;  Laterality: N/A;   HPI:  Patient is an 86 y.o. male with PMH: DM-2, dyslipidemia, CAD, atrial fibrillation, systolic heart failure, who presented with dyspnea, cough and lower extremity edema associated with productive cough and orthopnea.   Assessment / Plan / Recommendation Clinical Impression  Patient presents with an oropharyngeal swallow that is WNL and without any overt s/s of aspiration or penetration. Successive, straw sips of thin liquids were consumed by patient with laryngeal elevation and pharyngeal contraction both WNL per palpation. SLP Visit Diagnosis: Dysphagia, unspecified (R13.10)    Aspiration Risk  No limitations    Diet Recommendation Thin liquid;Regular   Liquid  Administration via: Straw;Cup Medication Administration: Whole meds with liquid Supervision: Patient able to self feed Postural Changes: Seated upright at 90 degrees    Other  Recommendations Oral Care Recommendations: Oral care BID;Patient independent with oral care   Follow up Recommendations None      Frequency and Duration   N/A         Prognosis N/A   Swallow Study   General Date of Onset: 05/15/20 HPI: Patient is an 86 y.o. male with PMH: DM-2, dyslipidemia, CAD, atrial fibrillation, systolic heart failure, who presented with dyspnea, cough and lower extremity edema associated with productive cough and orthopnea. Type of Study: Bedside Swallow Evaluation Previous Swallow Assessment: N/A Diet Prior to this Study: Thin liquids Temperature Spikes Noted: No Respiratory Status: Room air History of Recent Intubation: No Behavior/Cognition: Alert;Cooperative;Confused;Lethargic/Drowsy Oral Care Completed by SLP: Recent completion by staff Oral Cavity - Dentition: Adequate natural dentition Vision: Functional for self-feeding Self-Feeding Abilities: Needs assist Patient Positioning: Upright in bed Baseline Vocal Quality: Normal Volitional Cough: Strong Volitional Swallow: Able to elicit    Oral/Motor/Sensory Function Overall Oral Motor/Sensory Function: Within functional limits   Ice Chips     Thin Liquid Thin Liquid: Within functional limits Presentation: Self Fed;Straw Other Comments: no overt s/s of aspiration or penetration with thin liquids during successive straw sips    Nectar Thick     Honey Thick     Puree Puree: Not tested   Solid     Solid: Not tested      Angela Nevin, MA, CCC-SLP Speech Therapy [facility]  Acute Rehab

## 2020-05-15 NOTE — Progress Notes (Signed)
  Echocardiogram 2D Echocardiogram has been performed.  Janalyn Harder 05/15/2020, 9:12 AM

## 2020-05-16 LAB — PROCALCITONIN: Procalcitonin: 4.52 ng/mL

## 2020-05-16 LAB — BASIC METABOLIC PANEL
Anion gap: 13 (ref 5–15)
BUN: 60 mg/dL — ABNORMAL HIGH (ref 8–23)
CO2: 20 mmol/L — ABNORMAL LOW (ref 22–32)
Calcium: 8.5 mg/dL — ABNORMAL LOW (ref 8.9–10.3)
Chloride: 108 mmol/L (ref 98–111)
Creatinine, Ser: 2.22 mg/dL — ABNORMAL HIGH (ref 0.61–1.24)
GFR calc Af Amer: 30 mL/min — ABNORMAL LOW (ref >60)
GFR calc non Af Amer: 26 mL/min — ABNORMAL LOW (ref >60)
Glucose, Bld: 91 mg/dL (ref 70–99)
Potassium: 4.5 mmol/L (ref 3.5–5.1)
Sodium: 141 mmol/L (ref 135–145)

## 2020-05-16 LAB — GLUCOSE, CAPILLARY
Glucose-Capillary: 89 mg/dL (ref 70–99)
Glucose-Capillary: 153 mg/dL — ABNORMAL HIGH (ref 70–99)
Glucose-Capillary: 104 mg/dL — ABNORMAL HIGH (ref 70–99)
Glucose-Capillary: 114 mg/dL — ABNORMAL HIGH (ref 70–99)

## 2020-05-16 LAB — PROTIME-INR
INR: 1.9 — ABNORMAL HIGH (ref 0.8–1.2)
Prothrombin Time: 21 seconds — ABNORMAL HIGH (ref 11.4–15.2)

## 2020-05-16 MED ORDER — ROSUVASTATIN CALCIUM 5 MG PO TABS
5.0000 mg | ORAL_TABLET | ORAL | Status: DC
  Administered 2020-05-17: 5 mg via ORAL
  Filled 2020-05-16: qty 1

## 2020-05-16 MED ORDER — AMOXICILLIN-POT CLAVULANATE 875-125 MG PO TABS: 1.0000 | ORAL_TABLET | Freq: Two times a day (BID) | ORAL | Status: DC

## 2020-05-16 MED ORDER — SPIRONOLACTONE 25 MG PO TABS: 25.0000 mg | ORAL_TABLET | ORAL | Status: DC

## 2020-05-16 MED ORDER — LOSARTAN POTASSIUM 50 MG PO TABS
50.0000 mg | ORAL_TABLET | Freq: Every day | ORAL | Status: DC
  Administered 2020-05-16 – 2020-05-17 (×2): 50 mg via ORAL
  Filled 2020-05-16 (×2): qty 1

## 2020-05-16 MED ORDER — TAMSULOSIN HCL 0.4 MG PO CAPS: 0.4000 mg | ORAL_CAPSULE | Freq: Every day | ORAL | Status: DC | PRN

## 2020-05-16 MED ORDER — WARFARIN SODIUM 5 MG PO TABS
5.0000 mg | ORAL_TABLET | Freq: Once | ORAL | Status: AC
  Administered 2020-05-16: 5 mg via ORAL
  Filled 2020-05-16: qty 1

## 2020-05-16 MED ORDER — LOSARTAN POTASSIUM 50 MG PO TABS: 100.0000 mg | ORAL_TABLET | Freq: Every day | ORAL | Status: DC

## 2020-05-16 MED ORDER — WARFARIN SODIUM 3 MG PO TABS: 3.0000 mg | ORAL_TABLET | ORAL | Status: DC

## 2020-05-16 MED ORDER — AMOXICILLIN-POT CLAVULANATE 500-125 MG PO TABS
500.0000 mg | ORAL_TABLET | Freq: Two times a day (BID) | ORAL | Status: DC
  Administered 2020-05-16 – 2020-05-17 (×2): 500 mg via ORAL
  Filled 2020-05-16 (×2): qty 1

## 2020-05-16 MED ORDER — AMIODARONE HCL 200 MG PO TABS
200.0000 mg | ORAL_TABLET | Freq: Every day | ORAL | Status: DC
  Administered 2020-05-16 – 2020-05-17 (×2): 200 mg via ORAL
  Filled 2020-05-16 (×2): qty 1

## 2020-05-16 MED ORDER — WARFARIN - PHARMACIST DOSING INPATIENT: Freq: Every day | Status: DC

## 2020-05-16 MED ORDER — CARVEDILOL 25 MG PO TABS
25.0000 mg | ORAL_TABLET | Freq: Two times a day (BID) | ORAL | Status: DC
  Administered 2020-05-16 – 2020-05-17 (×3): 25 mg via ORAL
  Filled 2020-05-16 (×3): qty 1

## 2020-05-16 NOTE — Evaluation (Signed)
Occupational Therapy Evaluation Patient Details Name: Christopher Mann MRN: [id number] DOB: Jan 12, 1930 Today's Date: 05/16/2020    History of Present Illness 86 year old male with past medical history for type 2 diabetes mellitus, dyslipidemia, coronary artery disease, atrial fibrillation and systolic heart failure who presents with dyspnea, cough, and lower extremity edema.   Clinical Impression   On evaluation patient demonstrates ability to perform all ADLs, independent mobility with intermittent need to steady himself with one hand and without complaints of shortness of breath or impaired activity tolerance. Patient's oxygen sat 94% with activity on room air. No OT needs at this time.     Follow Up Recommendations  No OT follow up    Equipment Recommendations  None recommended by OT    Recommendations for Other Services       Precautions / Restrictions Precautions Precautions: None      Mobility Bed Mobility Overal bed mobility: Modified Independent                Transfers Overall transfer level: Modified independent               General transfer comment: Steadies himself on furniture at times but otherwise independent. Reports ambulating in hall with spouse.    Balance Overall balance assessment: Mild deficits observed, not formally tested                                         ADL either performed or assessed with clinical judgement   ADL Overall ADL's : Independent                                             Vision Baseline Vision/History: Wears glasses Wears Glasses: At all times Vision Assessment?: No apparent visual deficits     Perception     Praxis      Pertinent Vitals/Pain Pain Assessment: No/denies pain     Hand Dominance     Extremity/Trunk Assessment Upper Extremity Assessment Upper Extremity Assessment: Overall WFL for tasks assessed   Lower Extremity Assessment Lower Extremity  Assessment: Defer to PT evaluation   Cervical / Trunk Assessment Cervical / Trunk Assessment: Normal   Communication     Cognition Arousal/Alertness: Awake/alert Behavior During Therapy: WFL for tasks assessed/performed Overall Cognitive Status: Within Functional Limits for tasks assessed                                     General Comments       Exercises     Shoulder Instructions      Home Living Family/patient expects to be discharged to:: Private residence Living Arrangements: Spouse/significant other                                      Prior Functioning/Environment                   OT Problem List:        OT Treatment/Interventions:      OT Goals(Current goals can be found in the care plan section) Acute Rehab OT Goals OT Goal Formulation: All assessment and education  complete, DC therapy  OT Frequency:     Barriers to D/C:            Co-evaluation              AM-PAC OT "6 Clicks" Daily Activity     Outcome Measure Help from another person eating meals?: None Help from another person taking care of personal grooming?: None Help from another person toileting, which includes using toliet, bedpan, or urinal?: None Help from another person bathing (including washing, rinsing, drying)?: None Help from another person to put on and taking off regular upper body clothing?: None Help from another person to put on and taking off regular lower body clothing?: None 6 Click Score: 24   End of Session Nurse Communication: Mobility status  Activity Tolerance: Patient tolerated treatment well Patient left: in chair;with call bell/phone within reach;with family/visitor present  OT Visit Diagnosis: Muscle weakness (generalized) (M62.81)                Time: 8416-6063 OT Time Calculation (min): 12 min Charges:  OT General Charges $OT Visit: 1 Visit OT Evaluation $OT Eval Low Complexity: 1 Low  Christopher Mann, OTR/L Acute Care  Rehab Services  Office [phone] Pager: [phone]   Christopher Mann 05/16/2020, 4:21 PM

## 2020-05-16 NOTE — Progress Notes (Signed)
PHARMACY NOTE:  ANTIMICROBIAL RENAL DOSAGE ADJUSTMENT  Current antimicrobial regimen includes a mismatch between antimicrobial dosage and estimated renal function.  As per policy approved by the Pharmacy & Therapeutics and Medical Executive Committees, the antimicrobial dosage will be adjusted accordingly.  Current antimicrobial dosage:  Augmentin 875-125mg  PO BID  Indication: CAP  Renal Function:   Estimated Creatinine Clearance: 20.4 mL/min (A) (by C-G formula based on SCr of 2.22 mg/dL (H)). []      On intermittent HD, scheduled: []      On CRRT    Antimicrobial dosage has been changed to:  500mg  PO BID  Thank you for allowing pharmacy to be a part of this patient's care.  , PharmD, BCPS Pharmacy: [phone] 05/16/2020 11:30 AM

## 2020-05-16 NOTE — Progress Notes (Signed)
ANTICOAGULATION CONSULT NOTE - Initial Consult  Pharmacy Consult for warfarin Indication: atrial fibrillation, mechanical AVR  Allergies  Allergen Reactions  . Bee Venom Anaphylaxis  . Metoprolol     Other reaction(s): Other (See Comments) Bradycardia.  . Aspirin     Other reaction(s): Other (See Comments)  . Simvastatin     Other reaction(s): Other (See Comments) Muscle pain  . Pork-Derived Products     Patient Measurements: Height: 5\' 5"  (165.1 cm) Weight: 62.3 kg (137 lb 4.8 oz) IBW/kg (Calculated) : 61.5  Vital Signs: Temp: 98.7 F (37.1 C) (07/04 0437) Temp Source: Oral (07/04 0437) BP: 107/75 (07/04 0437) Pulse Rate: 59 (07/04 0437)  Labs: Recent Labs    05/14/20 0737 05/15/20 0246 05/16/20 0544  HGB 11.7*  --   --   HCT 36.6*  --   --   PLT 182  --   --   LABPROT 31.4* 31.9*  --   INR 3.2* 3.2*  --   CREATININE 2.35* 2.37* 2.22*    Estimated Creatinine Clearance: 20.4 mL/min (A) (by C-G formula based on SCr of 2.22 mg/dL (H)).   Medical History: Past Medical History:  Diagnosis Date  . Aortic stenosis    a. s/p redo aortic valve replacement in 1996 with mechanical prosthesis. On Coumadin  . Atrial flutter (HCC)    a. s/p DCCV in 08/2017  . Bundle branch block, left   . Chronic combined systolic and diastolic CHF (congestive heart failure) (HCC)    a. 2012: Echo showing EF of 25-30% b. 02/2016: Echo with EF of 20-25%, Grade 2 DD, mechanical AV functioning normally, PA Pressure 37 mm Hg  . Coronary artery disease 05/1995   a. s/p PCI with stent placement in 1996.  . Dilated cardiomyopathy (HCC)   . Elevated fasting blood sugar   . Hypercholesterolemia   . Hypertension     Medications:  Scheduled:  . amiodarone  200 mg Oral Daily  . amoxicillin-clavulanate  1 tablet Oral Q12H  . carvedilol  25 mg Oral BID  . Chlorhexidine Gluconate Cloth  6 each Topical Daily  . insulin aspart  0-9 Units Subcutaneous TID WC  . levothyroxine  50 mcg Oral  Q0600  . losartan  100 mg Oral Daily  . mouth rinse  15 mL Mouth Rinse BID  . [START ON 05/17/2020] rosuvastatin  5 mg Oral Q M,W,F  . sodium chloride flush  3 mL Intravenous Q12H  . spironolactone  25 mg Oral QODAY  . warfarin  3 mg Oral See admin instructions   Infusions:  . sodium chloride Stopped (05/14/20 1518)  . sodium chloride     PRN: sodium chloride, sodium chloride, acetaminophen, guaiFENesin-dextromethorphan, ondansetron (ZOFRAN) IV, sodium chloride flush, tamsulosin  Assessment: 86 yo male on chronic warfarin for afib and mechanical AVR.  Admitted 7/2 with CAP and HF decompensation.  Home warfarin. dose reported as 1.5mg  daily except 3mg  on Fridays - last dose taken 7/1.  INR on admission 3.2, warfarin held due to hemoptysis.  Pharmacy consulted to resume warfarin today since hemoptysis has improved.  Today, 05/16/2020   INR   CBC 7/2: Hgb 11.7, Plts wnl  Drug interaction: azithromycin 7/2-7/4, changed to augmentin which has less potential to affect INR  Tolerating carb modified diet  Goal of Therapy:  INR 2.5 - 3.5   Plan:   Warfarin 5mg  PO x 1 today  Daily PT/INR  07/17/2020, PharmD, BCPS Pharmacy: [phone] 05/16/2020,11:19 AM

## 2020-05-16 NOTE — Progress Notes (Signed)
Patient's daughter asked about patient's vitals, and labs, information given to the daughter at the bedside. Then stated she has not been updated on plan of care, I asked if she saw or spoke to the doctor yesterday she stated yes at 10am yesterday and did not see him the rest of the day she was with patient, informed daughter that the doctors round once a day unless there is a need to see patient again the same day, then she stated the doctor have not discussed the ECHO result with them and patient is not hooked up to the monitor, I informed patient's daughter that patient is on a heart monitor, and the ECHO was done yesterday after the MD saw her dad, and reinforced what I told her this morning that I already talk to MD about patient's lasix and he wants to see patient first, the MD will be coming to see patient soon. Dr. Ella Jubilee aware.

## 2020-05-16 NOTE — Progress Notes (Signed)
PROGRESS NOTE    Christopher Mann  YPP:[id number] DOB: April 26, 1930 DOA: 05/14/2020 PCP: [name], MD    Brief Narrative:  Patient was admitted to the hospital with a working diagnosis of community-acquired pneumonia complicated with acute on chronic systolic heart failure decompensation.   86 year old male with past medical history for type 2 diabetes mellitus, dyslipidemia, coronary artery disease, atrial fibrillation and systolic heart failure who presents with dyspnea, cough, and lower extremity edema.  Patient reported about 48 hours worsening lower extremity edema, associated with productive cough and orthopnea.  On his initial physical examination he was febrile 38.3 C, oxygen saturation 90% on room air, tachypneic 30 breaths/min, blood pressure 127/53, heart rate 58-79, on lung auscultation he had no wheezing or rhonchi, heart S1-S2 present and rhythmic, abdomen soft, positive pitting bilateral lower extremity edema.  Sodium 144, potassium 5.8, chloride 112, bicarb 19, glucose 178, BUN 54, creatinine 2.39, white count 12.0, hemoglobin 9.7, hematocrit 36.6, platelets 182.  SARS COVID-19 negative.  Chest radiograph, positive cardiomegaly, right lower lobe interstitial infiltrate.  CT of the abdomen with bilateral lower lobe infiltrates.  EKG 60 bpm, normal axis, left bundle branch block, corrected QTc 0.44, ventricular paced rhythm, no significant ST segment or T wave changes.   Assessment & Plan:   Principal Problem:   Multifocal pneumonia Active Problems:   Coronary artery disease   Hypertension   Atrial flutter (HCC)   Acute renal failure superimposed on stage 3 chronic kidney disease (HCC)   Acute on chronic combined systolic and diastolic CHF (congestive heart failure) (HCC)   Hyperkalemia    1. Multifocal, bilateral lower lobes community acquired pneumonia (present on admission). Patient's symptoms continue to improve but not yet back to baseline, cough and hemoptysis  getting better. His oxygenation this am is 93 to 97% on room air. His cultures continue with no growth.   Will change antibiotic therapy to Augmentin plan for 5 days total. Continue incentive spirometry. Out of bed to chair tid with meals and PT/OT evaluation.    2. Acute decompensation of chronic systolic heart failure/ aortic valve replacement (mechanical valve). Clinically his volume has improved. Echocardiogram with global hypokinesis, with LV EF 20 to 25%, with new ascending aortic dilatation 5 cm. Patient at home taking furosemide only once per week.   Will resume heart failure regimen with carvedilol and losartan. Follow up on blood pressure and renal function. Resume spironolactone.   Continue warfarin therapy for mechanical aortic valve. Follow pharmacy protocol.   3. T2DM controlled with Hgb A1c of 6,3. Patient tolerating po well, continue with insulin sliding scale for glucose cover and monitoring. Fasting glucose this am 91.     4. AKI on CKD stage 3b with hyperkalemia and non gap metabolic acidosis.  Renal function with serum cr 2.2 this am with K at 4,5 and serum bicarbonate at 20.   Considering patient had hyperkalemia on admission, will recommend continue diuretic therapy on oral furosemide at home and hold on spironolactone.   5. Paroxysmal atrial fibrillation/ flutter. Patient on paced rhythm and rate controlled. Resume amiodarone and continue warfarin for anticoagulation per pharmacy protocol.     Status is: Inpatient  Remains inpatient appropriate because: inpatient monitoring of hemodynamics in the setting of sever systolic heart failure.    Dispo: The patient is from: Home              Anticipated d/c is to: Home  Anticipated d/c date is: 1 day              Patient currently is not medically stable to d/c.   DVT prophylaxis: Warfarin   Code Status:   full  Family Communication:  I spoke with patient's daugter at the bedside, we talked in detail  about patient's condition, plan of care and prognosis and all questions were addressed.        Subjective: Patient is feeling better, not yet back to baseline, continue to have cough, but improving edema and dyspnea.   Objective: Vitals:   05/16/20 0156 05/16/20 0214 05/16/20 0437 05/16/20 0600  BP: (!) 113/46 121/62 107/75   Pulse: 70  (!) 59   Resp: 20 18 18    Temp: 98.8 F (37.1 C) 98.3 F (36.8 C) 98.7 F (37.1 C)   TempSrc: Oral Oral Oral   SpO2: 99% 97% 93%   Weight:    62.3 kg  Height:        Intake/Output Summary (Last 24 hours) at 05/16/2020 1027 Last data filed at 05/16/2020 07/17/2020 Gross per 24 hour  Intake 940 ml  Output --  Net 940 ml   Filed Weights   05/14/20 0726 05/14/20 2139 05/16/20 0600  Weight: 65.6 kg 62.3 kg 62.3 kg    Examination:   General: Not in pain or dyspnea, deconditioned  Neurology: Awake and alert, non focal  E ENT: mild pallor, no icterus, oral mucosa moist Cardiovascular: No JVD. S1-S2 present, rhythmic, no gallops, rubs, or murmurs. Trace lower extremity edema. Pulmonary:  positive breath sounds bilaterally, adequate air movement, no wheezing, or rhonchi , positive bibasilar rales. Gastrointestinal. Abdomen with, no organomegaly, non tender, no rebound or guarding Skin. No rashes Musculoskeletal: no joint deformities     Data Reviewed: I have personally reviewed following labs and imaging studies  CBC: Recent Labs  Lab 05/14/20 0737  WBC 12.0*  NEUTROABS 10.9*  HGB 11.7*  HCT 36.6*  MCV 99.2  PLT 182   Basic Metabolic Panel: Recent Labs  Lab 05/14/20 0737 05/15/20 0246 05/16/20 0544  NA 144 140 141  K 5.8* 5.0 4.5  CL 112* 111 108  CO2 19* 21* 20*  GLUCOSE 178* 106* 91  BUN 54* 56* 60*  CREATININE 2.35* 2.37* 2.22*  CALCIUM 9.2 8.3* 8.5*   GFR: Estimated Creatinine Clearance: 20.4 mL/min (A) (by C-G formula based on SCr of 2.22 mg/dL (H)). Liver Function Tests: Recent Labs  Lab 05/14/20 0737  AST 29  ALT  29  ALKPHOS 49  BILITOT 1.1  PROT 6.5  ALBUMIN 3.8   No results for input(s): LIPASE, AMYLASE in the last 168 hours. No results for input(s): AMMONIA in the last 168 hours. Coagulation Profile: Recent Labs  Lab 05/14/20 0737 05/15/20 0246  INR 3.2* 3.2*   Cardiac Enzymes: No results for input(s): CKTOTAL, CKMB, CKMBINDEX, TROPONINI in the last 168 hours. BNP (last 3 results) No results for input(s): PROBNP in the last 8760 hours. HbA1C: Recent Labs    05/15/20 0246  HGBA1C 6.3*   CBG: Recent Labs  Lab 05/15/20 0753 05/15/20 1235 05/15/20 1611 05/15/20 2235 05/16/20 0806  GLUCAP 89 171* 115* 117* 89   Lipid Profile: No results for input(s): CHOL, HDL, LDLCALC, TRIG, CHOLHDL, LDLDIRECT in the last 72 hours. Thyroid Function Tests: No results for input(s): TSH, T4TOTAL, FREET4, T3FREE, THYROIDAB in the last 72 hours. Anemia Panel: No results for input(s): VITAMINB12, FOLATE, FERRITIN, TIBC, IRON, RETICCTPCT in the last 72 hours.  Radiology Studies: I have reviewed all of the imaging during this hospital visit personally     Scheduled Meds: . azithromycin  500 mg Oral QHS  . Chlorhexidine Gluconate Cloth  6 each Topical Daily  . furosemide  40 mg Intravenous BID  . insulin aspart  0-9 Units Subcutaneous TID WC  . levothyroxine  50 mcg Oral Q0600  . mouth rinse  15 mL Mouth Rinse BID  . sodium chloride flush  3 mL Intravenous Q12H   Continuous Infusions: . sodium chloride Stopped (05/14/20 1518)  . sodium chloride    . cefTRIAXone (ROCEPHIN)  IV Stopped (05/15/20 2255)     LOS: 2 days        Mercie Balsley Annett Gula, MD

## 2020-05-17 LAB — BASIC METABOLIC PANEL
Anion gap: 8 (ref 5–15)
BUN: 60 mg/dL — ABNORMAL HIGH (ref 8–23)
CO2: 20 mmol/L — ABNORMAL LOW (ref 22–32)
Calcium: 8.7 mg/dL — ABNORMAL LOW (ref 8.9–10.3)
Chloride: 110 mmol/L (ref 98–111)
Creatinine, Ser: 2.1 mg/dL — ABNORMAL HIGH (ref 0.61–1.24)
GFR calc Af Amer: 32 mL/min — ABNORMAL LOW (ref >60)
GFR calc non Af Amer: 27 mL/min — ABNORMAL LOW (ref >60)
Glucose, Bld: 90 mg/dL (ref 70–99)
Potassium: 4.6 mmol/L (ref 3.5–5.1)
Sodium: 138 mmol/L (ref 135–145)

## 2020-05-17 LAB — LEGIONELLA PNEUMOPHILA SEROGP 1 UR AG: L. pneumophila Serogp 1 Ur Ag: NEGATIVE

## 2020-05-17 LAB — GLUCOSE, CAPILLARY: Glucose-Capillary: 72 mg/dL (ref 70–99)

## 2020-05-17 LAB — PROTIME-INR
INR: 1.9 — ABNORMAL HIGH (ref 0.8–1.2)
Prothrombin Time: 21 seconds — ABNORMAL HIGH (ref 11.4–15.2)

## 2020-05-17 MED ORDER — GUAIFENESIN-DM 100-10 MG/5ML PO SYRP: 5.0000 mL | ORAL_SOLUTION | Freq: Four times a day (QID) | ORAL | 0 refills | Status: AC | PRN

## 2020-05-17 MED ORDER — FUROSEMIDE 40 MG PO TABS
40.0000 mg | ORAL_TABLET | Freq: Every day | ORAL | Status: DC
  Administered 2020-05-17: 40 mg via ORAL
  Filled 2020-05-17: qty 1

## 2020-05-17 MED ORDER — LOSARTAN POTASSIUM 50 MG PO TABS: 50.0000 mg | ORAL_TABLET | Freq: Every day | ORAL | 0 refills | Status: AC

## 2020-05-17 MED ORDER — FUROSEMIDE 40 MG PO TABS: 40.0000 mg | ORAL_TABLET | Freq: Every day | ORAL | 0 refills | Status: AC

## 2020-05-17 MED ORDER — WARFARIN SODIUM 5 MG PO TABS: 5.0000 mg | ORAL_TABLET | Freq: Once | ORAL | Status: DC

## 2020-05-17 MED ORDER — CARVEDILOL 25 MG PO TABS: 25.0000 mg | ORAL_TABLET | Freq: Two times a day (BID) | ORAL | 0 refills | Status: AC

## 2020-05-17 MED ORDER — AMOXICILLIN-POT CLAVULANATE 500-125 MG PO TABS: 500.0000 mg | ORAL_TABLET | Freq: Two times a day (BID) | ORAL | 0 refills | Status: AC

## 2020-05-17 NOTE — Evaluation (Signed)
Physical Therapy Evaluation-1x Patient Details Name: Christopher Mann MRN: [id number] DOB: 02/03/1930 Today's Date: 05/17/2020   History of Present Illness  86 year old male with past medical history for type 2 diabetes mellitus, dyslipidemia, coronary artery disease, atrial fibrillation and systolic heart failure who presents with dyspnea, cough, and lower extremity edema.  Clinical Impression  On eval, pt was Mod Ind with mobility. No acute PT needs-1x eval. Plan is for d/c home today.     Follow Up Recommendations No PT follow up    Equipment Recommendations  None recommended by PT    Recommendations for Other Services       Precautions / Restrictions Precautions Precautions: None Restrictions Weight Bearing Restrictions: No      Mobility  Bed Mobility               General bed mobility comments: oob in bathroom  Transfers Overall transfer level: Modified independent                  Ambulation/Gait Ambulation/Gait assistance: Modified independent (Device/Increase time) Gait Distance (Feet): 250 Feet Assistive device: None Gait Pattern/deviations: Step-through pattern        Stairs            Wheelchair Mobility    Modified Rankin (Stroke Patients Only)       Balance Overall balance assessment: Mild deficits observed, not formally tested                                           Pertinent Vitals/Pain Pain Assessment: No/denies pain    Home Living Family/patient expects to be discharged to:: Private residence Living Arrangements: Spouse/significant other Available Help at Discharge: Family           Home Equipment: None      Prior Function Level of Independence: Independent               Hand Dominance        Extremity/Trunk Assessment   Upper Extremity Assessment Upper Extremity Assessment: Defer to OT evaluation    Lower Extremity Assessment Lower Extremity Assessment: Overall WFL for  tasks assessed    Cervical / Trunk Assessment Cervical / Trunk Assessment: Normal  Communication   Communication: No difficulties  Cognition Arousal/Alertness: Awake/alert Behavior During Therapy: WFL for tasks assessed/performed Overall Cognitive Status: Within Functional Limits for tasks assessed                                        General Comments      Exercises     Assessment/Plan    PT Assessment Patent does not need any further PT services  PT Problem List         PT Treatment Interventions      PT Goals (Current goals can be found in the Care Plan section)  Acute Rehab PT Goals Patient Stated Goal: home PT Goal Formulation: All assessment and education complete, DC therapy    Frequency     Barriers to discharge        Co-evaluation               AM-PAC PT "6 Clicks" Mobility  Outcome Measure Help needed turning from your back to your side while in a flat bed without using bedrails?: None Help  needed moving from lying on your back to sitting on the side of a flat bed without using bedrails?: None Help needed moving to and from a bed to a chair (including a wheelchair)?: None Help needed standing up from a chair using your arms (e.g., wheelchair or bedside chair)?: None Help needed to walk in hospital room?: None Help needed climbing 3-5 steps with a railing? : None 6 Click Score: 24    End of Session   Activity Tolerance: Patient tolerated treatment well Patient left: in bed;with family/visitor present        Time: 1013-1021 PT Time Calculation (min) (ACUTE ONLY): 8 min   Charges:   PT Evaluation $PT Eval Low Complexity: 1 Low             Faye Ramsay, PT Acute Rehabilitation  Office: [phone] Pager: [phone]

## 2020-05-17 NOTE — Discharge Summary (Addendum)
Physician Discharge Summary  DAELON DUNIVAN VWP:[id number] DOB: 02/26/1930 DOA: 05/14/2020  PCP: [name], MD  Admit date: 05/14/2020 Discharge date: 05/17/2020  Admitted From: Home  Disposition:  Home   Recommendations for Outpatient Follow-up and new medication changes:  1. Follow up with Dr. Georgena Spurling in 7 days.  2. Follow renal panel in 7 days. 3. Will hold on spironolactone to prevent hyperkalemia. 4. Will decrease losartan to 50 mg to prevent hypotension. 5. Start furosemide 40 mg daily.   Home Health: no   Equipment/Devices: no    Discharge Condition: stable  CODE STATUS: full  Diet recommendation: heart healthy with sodium restriction.   Brief/Interim Summary: Patient was admitted to the hospital with a working diagnosis of community-acquired pneumoniacomplicated with acute on chronic systolic heart failure decompensation.  86 year old male with past medical history for type 2 diabetes mellitus, dyslipidemia, coronary artery disease, atrial fibrillation and systolic heart failure who presents with dyspnea,cough, and lower extremity edema. Patient reported about 48 hours worsening lower extremity edema, associated with productive cough and orthopnea. On his initial physical examination he was febrile 38.3 C, oxygen saturation 90% on room air, tachypneic 30 breaths/min, blood pressure 127/53, heart rate 58-79, on lung auscultation he hadno wheezing or rhonchi but positive bibasilar rales, heart S1-S2 present and rhythmic, abdomen soft, positive pitting bilateral lower extremity edema. Sodium 144, potassium 5.8, chloride 112, bicarb 19, glucose 178, BUN 54, creatinine 2.39, white count 12.0, hemoglobin 9.7, hematocrit 36.6,platelets 182.SARS COVID-19 negative. Chest radiograph, positive cardiomegaly, right lower lobe interstitial infiltrate. CT of the abdomen with bilateral lower lobe infiltrates. EKG 60 bpm, normal axis, left bundle branch block morphology, corrected  QTc0.44, ventricular paced rhythm, no significant ST segment or T wave changes.  Patient received antibiotic therapy and aggressive diuresis with improvement in his symptoms.   1.  Multifocal bilateral bilateral lower lobes, community-acquired pneumonia, present on admission.  Patient received supplemental oxygen per nasal cannula, antibiotic therapy with intravenous ceftriaxone and oral azithromycin.  His symptoms have improved, his cultures have been no growth, patient will continue taking Augmentin for 2 more days to complete a 5-day regimen.  2.  Acute decompensation of chronic systolic heart failure/status post aortic valve replacement.  Patient was placed on a remote telemetry monitor, he remained in a paced, V paced rhythm.  Further work-up with echocardiography revealed global hypokinesis with akinesis of the inferior and inferior lateral wall of the left ventricle.  Positive severe dilatation of the ascending aorta, 5 cm.  (Compared from echocardiography from May 2019, the ejection fraction has remained stable 20 to 25%, then the ascending aorta was reported also to be dilated at 5.1 centimeters).   He received diuresis with intravenous furosemide with good toleration.  A negative fluid balance was achieved, with significant improvement of his symptoms.  Patient will continue taking furosemide, 40 mg daily, his losartan has been decreased to 50 mg daily to prevent hypotension.  Continue 25 mg of carvedilol twice daily.  3.  Paroxysmal atrial fibrillation/ flutter.  Patient remained a paced, V paced on telemetry monitoring.  Patient will continue carvedilol, and amiodarone.  Anticoagulation with warfarin.  Discharge INR 1.9.  4.  Acute kidney injury on chronic kidney disease stage IIIb, hyperkalemia, nongap metabolic acidosis.  Patient responded well to diuresis, his discharge creatinine was 2.10, sodium 138, potassium 4.6, chloride 110, bicarb 20 and BUN 19.   Due to hyperkalemia will  discontinue spironolactone, patient will continue taking furosemide 40 mg daily, follow-up kidney function within  7 days.    Discharge Diagnoses:  Principal Problem:   Multifocal pneumonia Active Problems:   Coronary artery disease   Hypertension   Atrial flutter (HCC)   Acute renal failure superimposed on stage 3 chronic kidney disease (HCC)   Acute on chronic combined systolic and diastolic CHF (congestive heart failure) (HCC)   Hyperkalemia    Discharge Instructions   Allergies as of 05/17/2020      Reactions   Bee Venom Anaphylaxis   Metoprolol    Other reaction(s): Other (See Comments) Bradycardia.   Aspirin    Other reaction(s): Other (See Comments)   Simvastatin    Other reaction(s): Other (See Comments) Muscle pain   Pork-derived Products       Medication List    STOP taking these medications   spironolactone 25 MG tablet Commonly known as: ALDACTONE     TAKE these medications   acetaminophen 500 MG tablet Commonly known as: TYLENOL Take 1,000 mg by mouth daily as needed for moderate pain.   amiodarone 200 MG tablet Commonly known as: PACERONE Take 200 mg by mouth.   amoxicillin-clavulanate 500-125 MG tablet Commonly known as: AUGMENTIN Take 1 tablet (500 mg total) by mouth every 12 (twelve) hours for 2 days.   carvedilol 25 MG tablet Commonly known as: COREG Take 1 tablet (25 mg total) by mouth 2 (two) times daily. What changed:   medication strength  how much to take   furosemide 40 MG tablet Commonly known as: LASIX Take 1 tablet (40 mg total) by mouth daily. Start taking on: May 18, 2020 What changed:   medication strength  how much to take  when to take this  additional instructions   guaiFENesin-dextromethorphan 100-10 MG/5ML syrup Commonly known as: ROBITUSSIN DM Take 5 mLs by mouth every 6 (six) hours as needed for cough.   levothyroxine 50 MCG tablet Commonly known as: SYNTHROID Take 50 mcg by mouth every morning.    losartan 50 MG tablet Commonly known as: COZAAR Take 1 tablet (50 mg total) by mouth daily. Start taking on: May 18, 2020 What changed:   medication strength  how much to take   OVER THE COUNTER MEDICATION Take 3 capsules by mouth daily. OCC dietary supplement   rosuvastatin 5 MG tablet Commonly known as: CRESTOR Take 5 mg by mouth daily.   Saw Palmetto 450 MG Caps Take 1,350 mg by mouth every Monday, Wednesday, and Friday.   tamsulosin 0.4 MG Caps capsule Commonly known as: FLOMAX Take 0.4 mg by mouth daily as needed (for urinary symptoms).   warfarin 3 MG tablet Commonly known as: COUMADIN Take 3 mg by mouth See admin instructions. Take 1.5mg  by mouth daily except 3mg  on Friday.       Allergies  Allergen Reactions  . Bee Venom Anaphylaxis  . Metoprolol     Other reaction(s): Other (See Comments) Bradycardia.  . Aspirin     Other reaction(s): Other (See Comments)  . Simvastatin     Other reaction(s): Other (See Comments) Muscle pain  . Pork-Derived Products       Procedures/Studies: CT ABDOMEN PELVIS WO CONTRAST  Result Date: 05/14/2020 CLINICAL DATA:  Fevers with right-sided flank pain, initial encounter EXAM: CT ABDOMEN AND PELVIS WITHOUT CONTRAST TECHNIQUE: Multidetector CT imaging of the abdomen and pelvis was performed following the standard protocol without IV contrast. COMPARISON:  None. FINDINGS: Lower chest: Bilateral lower lobe infiltrates are seen which may represent a component of aspiration. Associated small effusions are noted bilaterally.  Hepatobiliary: Scattered hepatic cysts are noted. The gallbladder is within normal limits. Pancreas: Unremarkable. No pancreatic ductal dilatation or surrounding inflammatory changes. Spleen: Normal in size without focal abnormality. Adrenals/Urinary Tract: Adrenal glands are unremarkable. No renal calculi or obstructive changes are noted. There is a chronic changes as described above without acute abnormality.  Right parapelvic cyst measuring 3.8 cm. No obstructive changes are seen. The bladder is well distended. Stomach/Bowel: Scattered diverticular change of the colon is noted without evidence of diverticulitis. No obstructive or inflammatory changes are seen. The appendix is not well visualized. No inflammatory changes to suggest appendicitis are noted. Small bowel and stomach appear within normal limits. Vascular/Lymphatic: Aortic atherosclerosis. No enlarged abdominal or pelvic lymph nodes. Reproductive: Prostate is unremarkable. Other: No abdominal wall hernia or abnormality. No abdominopelvic ascites. Musculoskeletal: Degenerative changes of lumbar spine are seen. Mild anterolisthesis of L4 on L5 is noted of a degenerative nature. IMPRESSION: Bilateral lower lobe infiltrates and effusions consistent with multifocal pneumonia possibly related to aspiration. Electronically Signed   By: Alcide Clever M.D.   On: 05/14/2020 09:45   DG Chest Port 1 View  Result Date: 05/14/2020 CLINICAL DATA:  Hemoptysis. EXAM: PORTABLE CHEST 1 VIEW COMPARISON:  December 14, 2017. FINDINGS: Stable cardiomegaly. Status post aortic valve repair. Left-sided pacemaker is unchanged in position. No pneumothorax is noted. Mild bibasilar atelectasis is noted. Small right pleural effusion is noted. Bony thorax is unremarkable. IMPRESSION: Mild bibasilar subsegmental atelectasis. Small right pleural effusion. Aortic Atherosclerosis (ICD10-I70.0). Electronically Signed   By: Lupita Raider M.D.   On: 05/14/2020 08:10   ECHOCARDIOGRAM COMPLETE  Result Date: 05/15/2020    ECHOCARDIOGRAM REPORT   Patient Name:   BRET VANESSEN Date of Exam: 05/15/2020 Medical Rec #:  [id number]      Height:       65.0 in Accession #:    [id number]     Weight:       137.3 lb Date of Birth:  January 21, 1930      BSA:          1.686 m Patient Age:    86 years       BP:           124/50 mmHg Patient Gender: M              HR:           70 bpm. Exam Location:  Inpatient  Procedure: 2D Echo, 3D Echo, Cardiac Doppler and Color Doppler Indications:    I50.40* Unspecified combined systolic (congestive) and diastolic                 (congestive) heart failure  History:        Patient has prior history of Echocardiogram examinations, most                 recent 03/15/2018. CHF, Abnormal ECG and Defibrillator, Aortic                 Valve Disease, Arrythmias:Atrial Flutter and LBBB,                 Signs/Symptoms:Dyspnea; Risk Factors:Hypertension, Diabetes and                 Dyslipidemia. Severe aortic stenosis. S/P AVR with mechanical                 valve.                 Aortic Valve: valve  is present in the aortic position.  Sonographer:    Sheralyn Boatman RDCS Referring Phys: [id number] TIMOTHY S OPYD IMPRESSIONS  1. Definity used; global hypokinesis with akinesis of the inferior and inferolateral wall with overall severe LV dysfunction; severe LVE; restrictive filliing; severely dilated ascending aorta (5 cm; suggest CTA to further assess); s/p AVR with trace AI  and mean gradient of 8 mmHg; mild MR; severe LAE.  2. Left ventricular ejection fraction, by estimation, is 20 to 25%. The left ventricle has severely decreased function. The left ventricle demonstrates regional wall motion abnormalities (see scoring diagram/findings for description). The left ventricular internal cavity size was severely dilated. Left ventricular diastolic parameters are consistent with Grade III diastolic dysfunction (restrictive). Elevated left atrial pressure.  3. Right ventricular systolic function is normal. The right ventricular size is normal.  4. Left atrial size was severely dilated.  5. The mitral valve is normal in structure. Mild mitral valve regurgitation. No evidence of mitral stenosis.  6. The aortic valve has been repaired/replaced. Aortic valve regurgitation is trivial. No aortic stenosis is present. There is a valve present in the aortic position.  7. Aortic dilatation noted. There is severe  dilatation of the ascending aorta measuring 50 mm.  8. The inferior vena cava is normal in size with greater than 50% respiratory variability, suggesting right atrial pressure of 3 mmHg. FINDINGS  Left Ventricle: Left ventricular ejection fraction, by estimation, is 20 to 25%. The left ventricle has severely decreased function. The left ventricle demonstrates regional wall motion abnormalities. Definity contrast agent was given IV to delineate the left ventricular endocardial borders. The left ventricular internal cavity size was severely dilated. There is no left ventricular hypertrophy. Left ventricular diastolic parameters are consistent with Grade III diastolic dysfunction (restrictive). Elevated left atrial pressure. Right Ventricle: The right ventricular size is normal. Right ventricular systolic function is normal. Left Atrium: Left atrial size was severely dilated. Right Atrium: Right atrial size was normal in size. Pericardium: There is no evidence of pericardial effusion. Mitral Valve: The mitral valve is normal in structure. Normal mobility of the mitral valve leaflets. Mild mitral annular calcification. Mild mitral valve regurgitation. No evidence of mitral valve stenosis. MV peak gradient, 15.0 mmHg. The mean mitral valve gradient is 2.5 mmHg. Tricuspid Valve: The tricuspid valve is normal in structure. Tricuspid valve regurgitation is mild . No evidence of tricuspid stenosis. Aortic Valve: The aortic valve has been repaired/replaced. Aortic valve regurgitation is trivial. No aortic stenosis is present. Aortic valve mean gradient measures 8.0 mmHg. Aortic valve peak gradient measures 16.1 mmHg. Aortic valve area, by VTI measures 2.24 cm. There is a valve present in the aortic position. Pulmonic Valve: The pulmonic valve was normal in structure. Pulmonic valve regurgitation is not visualized. No evidence of pulmonic stenosis. Aorta: The aortic root is normal in size and structure and aortic dilatation  noted. There is severe dilatation of the ascending aorta measuring 50 mm. Venous: The inferior vena cava is normal in size with greater than 50% respiratory variability, suggesting right atrial pressure of 3 mmHg.  Additional Comments: Definity used; global hypokinesis with akinesis of the inferior and inferolateral wall with overall severe LV dysfunction; severe LVE; restrictive filliing; severely dilated ascending aorta (5 cm; suggest CTA to further assess); s/p AVR with trace AI and mean gradient of 8 mmHg; mild MR; severe LAE. A pacer wire is visualized.  LEFT VENTRICLE PLAX 2D LVIDd:         6.23 cm  Diastology LVIDs:         6.04 cm      LV e' lateral:   4.39 cm/s LV PW:         1.46 cm      LV E/e' lateral: 36.0 LV IVS:        1.01 cm      LV e' medial:    2.93 cm/s LVOT diam:     2.40 cm      LV E/e' medial:  53.9 LV SV:         88 LV SV Index:   52 LVOT Area:     4.52 cm  LV Volumes (MOD) LV vol d, MOD A2C: 285.0 ml LV vol d, MOD A4C: 283.3 ml LV vol s, MOD A2C: 213.0 ml LV vol s, MOD A4C: 216.3 ml LV SV MOD A2C:     72.0 ml LV SV MOD A4C:     283.3 ml LV SV MOD BP:      67.0 ml RIGHT VENTRICLE            IVC RV S prime:     8.11 cm/s  IVC diam: 2.04 cm TAPSE (M-mode): 1.7 cm LEFT ATRIUM              Index       RIGHT ATRIUM           Index LA diam:        4.80 cm  2.85 cm/m  RA Area:     15.60 cm LA Vol (A2C):   123.0 ml 72.95 ml/m RA Volume:   40.60 ml  24.08 ml/m LA Vol (A4C):   93.3 ml  55.33 ml/m LA Biplane Vol: 112.0 ml 66.42 ml/m  AORTIC VALVE AV Area (Vmax):    1.89 cm AV Area (Vmean):   1.91 cm AV Area (VTI):     2.24 cm AV Vmax:           200.50 cm/s AV Vmean:          131.500 cm/s AV VTI:            0.392 m AV Peak Grad:      16.1 mmHg AV Mean Grad:      8.0 mmHg LVOT Vmax:         83.60 cm/s LVOT Vmean:        55.600 cm/s LVOT VTI:          0.194 m LVOT/AV VTI ratio: 0.50  AORTA Ao Root diam: 3.90 cm Ao Asc diam:  5.00 cm MITRAL VALVE MV Area (PHT): 3.39 cm     SHUNTS MV Peak  grad:  15.0 mmHg    Systemic VTI:  0.19 m MV Mean grad:  2.5 mmHg     Systemic Diam: 2.40 cm MV Vmax:       1.94 m/s MV Vmean:      69.3 cm/s MV Decel Time: 224 msec MV E velocity: 158.00 cm/s Olga Millers MD Electronically signed by Olga Millers MD Signature Date/Time: 05/15/2020/11:46:56 AM    Final         Subjective: Patient is feeling better, no nausea or vomiting, dyspnea has improved along with lower extremity edema.   Discharge Exam: Vitals:   05/16/20 2018 05/17/20 0436  BP: 110/61 (!) 109/54  Pulse: 64 77  Resp: 18 16  Temp: 98 F (36.7 C) 98.5 F (36.9 C)  SpO2: 98% 94%   Vitals:   05/16/20 1157 05/16/20  1555 05/16/20 2018 05/17/20 0436  BP: (!) 119/56  110/61 (!) 109/54  Pulse: 66  64 77  Resp: 20  18 16   Temp: 98 F (36.7 C)  98 F (36.7 C) 98.5 F (36.9 C)  TempSrc: Oral   Oral  SpO2: 94% 94% 98% 94%  Weight:    65.4 kg  Height:        General: Not in pain or dyspnea Neurology: Awake and alert, non focal  E ENT: no pallor, no icterus, oral mucosa moist Cardiovascular: No JVD. S1-S2 present, rhythmic, no gallops, rubs, or murmurs. Trace pitting lower extremity edema. Pulmonary: positive breath sounds bilaterally, no wheezing, rhonchi or mild bibasilar rales. Gastrointestinal. Abdomen with no organomegaly, non tender, no rebound or guarding Skin. No rashes Musculoskeletal: no joint deformities   The results of significant diagnostics from this hospitalization (including imaging, microbiology, ancillary and laboratory) are listed below for reference.     Microbiology: Recent Results (from the past 240 hour(s))  SARS Coronavirus 2 by RT PCR (hospital order, performed in [facility] hospital lab) Nasopharyngeal Nasopharyngeal Swab     Status: None   Collection Time: 05/14/20  7:37 AM   Specimen: Nasopharyngeal Swab  Result Value Ref Range Status   SARS Coronavirus 2 NEGATIVE NEGATIVE Final    Comment: (NOTE) SARS-CoV-2 target nucleic acids are NOT  DETECTED.  The SARS-CoV-2 RNA is generally detectable in upper and lower respiratory specimens during the acute phase of infection. The lowest concentration of SARS-CoV-2 viral copies this assay can detect is 250 copies / mL. A negative result does not preclude SARS-CoV-2 infection and should not be used as the sole basis for treatment or other patient management decisions.  A negative result may occur with improper specimen collection / handling, submission of specimen other than nasopharyngeal swab, presence of viral mutation(s) within the areas targeted by this assay, and inadequate number of viral copies (<250 copies / mL). A negative result must be combined with clinical observations, patient history, and epidemiological information.  Fact Sheet for Patients:   BoilerBrush.com.cy  Fact Sheet for Healthcare Providers: https://pope.com/  This test is not yet approved or  cleared by the [place] FDA and has been authorized for detection and/or diagnosis of SARS-CoV-2 by FDA under an Emergency Use Authorization (EUA).  This EUA will remain in effect (meaning this test can be used) for the duration of the COVID-19 declaration under Section 564(b)(1) of the Act, 21 U.S.C. section 360bbb-3(b)(1), unless the authorization is terminated or revoked sooner.  Performed at [facility], [address], [place], [place] [address]   Urine Culture     Status: Abnormal   Collection Time: 05/14/20  8:32 AM   Specimen: Urine, Clean Catch  Result Value Ref Range Status   Specimen Description   Final    URINE, CLEAN CATCH Performed at [facility], [address] [place], [place] [address]    Special Requests   Final    NONE Performed at [facility], [address] [place], [place] [address]    Culture (A)  Final    <10,000 COLONIES/mL INSIGNIFICANT GROWTH Performed at [facility], [address], [place], [place] [address]    Report Status 05/15/2020 FINAL  Final  Blood culture (routine x 2)     Status: None (Preliminary result)   Collection Time: 05/14/20  9:36 AM   Specimen: BLOOD RIGHT ARM  Result Value Ref Range  Status   Specimen Description   Final    BLOOD RIGHT ARM Performed at [facility], [address] [place], [place] [address]    Special Requests   Final    BOTTLES DRAWN AEROBIC AND ANAEROBIC Blood Culture adequate volume Performed at [facility], [address] [place], [place] [address]    Culture   Final    NO GROWTH 2 DAYS Performed at [facility], [address], [place], [place] [address]    Report Status PENDING  Incomplete  Blood culture (routine x 2)     Status: None (Preliminary result)   Collection Time: 05/14/20  9:45 AM   Specimen: BLOOD RIGHT FOREARM  Result Value Ref Range Status   Specimen Description   Final    BLOOD RIGHT FOREARM Performed at [facility], [address] [place], [place] [address]    Special Requests   Final    BOTTLES DRAWN AEROBIC AND ANAEROBIC Blood Culture adequate volume Performed at [facility], [address] [place], [place] [address]    Culture   Final    NO GROWTH 2 DAYS Performed at [facility], [address] [place], [place] [address]    Report Status PENDING  Incomplete  MRSA PCR Screening     Status: None   Collection Time: 05/14/20  9:00 PM   Specimen: Nasopharyngeal  Result Value Ref Range Status   MRSA by PCR NEGATIVE NEGATIVE Final    Comment:        The GeneXpert MRSA Assay (FDA approved for NASAL specimens only), is one component of a comprehensive MRSA colonization surveillance program. It is not intended to diagnose MRSA infection nor to guide or monitor treatment for MRSA infections. Performed at [facility], [address] [place], [place] [address]      Labs: BNP (last 3 results) Recent Labs     05/14/20 0737  BNP 1,999.4*   Basic Metabolic Panel: Recent Labs  Lab 05/14/20 0737 05/15/20 0246 05/16/20 0544 05/17/20 0519  NA 144 140 141 138  K 5.8* 5.0 4.5 4.6  CL 112* 111 108 110  CO2 19* 21* 20* 20*  GLUCOSE 178* 106* 91 90  BUN 54* 56* 60* 60*  CREATININE 2.35* 2.37* 2.22* 2.10*  CALCIUM 9.2 8.3* 8.5* 8.7*   Liver Function Tests: Recent Labs  Lab 05/14/20 0737  AST 29  ALT 29  ALKPHOS 49  BILITOT 1.1  PROT 6.5  ALBUMIN 3.8   No results for input(s): LIPASE, AMYLASE in the last 168 hours. No results for input(s): AMMONIA in the last 168 hours. CBC: Recent Labs  Lab 05/14/20 0737  WBC 12.0*  NEUTROABS 10.9*  HGB 11.7*  HCT 36.6*  MCV 99.2  PLT 182   Cardiac Enzymes: No results for input(s): CKTOTAL, CKMB, CKMBINDEX, TROPONINI in the last 168 hours. BNP: Invalid input(s): POCBNP CBG: Recent Labs  Lab 05/16/20 0806 05/16/20 1159 05/16/20 1646 05/16/20 2016 05/17/20 0731  GLUCAP 89 153* 104* 114* 72   D-Dimer No results for input(s): DDIMER in the last 72 hours. Hgb A1c Recent Labs    05/15/20 0246  HGBA1C 6.3*   Lipid Profile No results for input(s): CHOL, HDL, LDLCALC, TRIG, CHOLHDL, LDLDIRECT in the last 72 hours. Thyroid function studies No results for input(s): TSH, T4TOTAL, T3FREE, THYROIDAB in the last 72 hours.  Invalid input(s): FREET3 Anemia work up No results for  input(s): VITAMINB12, FOLATE, FERRITIN, TIBC, IRON, RETICCTPCT in the last 72 hours. Urinalysis    Component Value Date/Time   COLORURINE YELLOW 05/14/2020 0832   APPEARANCEUR CLEAR 05/14/2020 0832   LABSPEC 1.020 05/14/2020 0832   PHURINE 5.0 05/14/2020 0832   GLUCOSEU NEGATIVE 05/14/2020 0832   HGBUR TRACE (A) 05/14/2020 0832   BILIRUBINUR NEGATIVE 05/14/2020 0832   KETONESUR NEGATIVE 05/14/2020 0832   PROTEINUR NEGATIVE 05/14/2020 0832   NITRITE NEGATIVE 05/14/2020 0832   LEUKOCYTESUR NEGATIVE 05/14/2020 0832   Sepsis Labs Invalid input(s):  PROCALCITONIN,  WBC,  LACTICIDVEN Microbiology Recent Results (from the past 240 hour(s))  SARS Coronavirus 2 by RT PCR (hospital order, performed in [facility] hospital lab) Nasopharyngeal Nasopharyngeal Swab     Status: None   Collection Time: 05/14/20  7:37 AM   Specimen: Nasopharyngeal Swab  Result Value Ref Range Status   SARS Coronavirus 2 NEGATIVE NEGATIVE Final    Comment: (NOTE) SARS-CoV-2 target nucleic acids are NOT DETECTED.  The SARS-CoV-2 RNA is generally detectable in upper and lower respiratory specimens during the acute phase of infection. The lowest concentration of SARS-CoV-2 viral copies this assay can detect is 250 copies / mL. A negative result does not preclude SARS-CoV-2 infection and should not be used as the sole basis for treatment or other patient management decisions.  A negative result may occur with improper specimen collection / handling, submission of specimen other than nasopharyngeal swab, presence of viral mutation(s) within the areas targeted by this assay, and inadequate number of viral copies (<250 copies / mL). A negative result must be combined with clinical observations, patient history, and epidemiological information.  Fact Sheet for Patients:   BoilerBrush.com.cy  Fact Sheet for Healthcare Providers: https://pope.com/  This test is not yet approved or  cleared by the [place] FDA and has been authorized for detection and/or diagnosis of SARS-CoV-2 by FDA under an Emergency Use Authorization (EUA).  This EUA will remain in effect (meaning this test can be used) for the duration of the COVID-19 declaration under Section 564(b)(1) of the Act, 21 U.S.C. section 360bbb-3(b)(1), unless the authorization is terminated or revoked sooner.  Performed at [facility], [address] [place], [place] [address]   Urine Culture     Status: Abnormal   Collection Time: 05/14/20  8:32  AM   Specimen: Urine, Clean Catch  Result Value Ref Range Status   Specimen Description   Final    URINE, CLEAN CATCH Performed at [facility], [address] [place], [place] [address]    Special Requests   Final    NONE Performed at [facility], [address] [place], [place] [address]    Culture (A)  Final    <10,000 COLONIES/mL INSIGNIFICANT GROWTH Performed at [facility], [address], [place], [place] [address]    Report Status 05/15/2020 FINAL  Final  Blood culture (routine x 2)     Status: None (Preliminary result)   Collection Time: 05/14/20  9:36 AM   Specimen: BLOOD RIGHT ARM  Result Value Ref Range Status   Specimen Description   Final    BLOOD RIGHT ARM Performed at [facility], [address] [place], [place] [address]    Special Requests   Final    BOTTLES DRAWN AEROBIC AND ANAEROBIC Blood Culture adequate volume Performed at [facility], [address], [place], [place] [address]  Culture   Final    NO GROWTH 2 DAYS Performed at [facility], [address] [place], [place] [address]    Report Status PENDING  Incomplete  Blood culture (routine x 2)     Status: None (Preliminary result)   Collection Time: 05/14/20  9:45 AM   Specimen: BLOOD RIGHT FOREARM  Result Value Ref Range Status   Specimen Description   Final    BLOOD RIGHT FOREARM Performed at [facility], [address] [place], [place] [address]    Special Requests   Final    BOTTLES DRAWN AEROBIC AND ANAEROBIC Blood Culture adequate volume Performed at [facility], [address] [place], [place] [address]    Culture   Final    NO GROWTH 2 DAYS Performed at [facility], [address], [place], [place] [address]    Report Status PENDING  Incomplete  MRSA PCR Screening     Status: None   Collection Time: 05/14/20  9:00 PM   Specimen: Nasopharyngeal  Result Value Ref Range Status   MRSA  by PCR NEGATIVE NEGATIVE Final    Comment:        The GeneXpert MRSA Assay (FDA approved for NASAL specimens only), is one component of a comprehensive MRSA colonization surveillance program. It is not intended to diagnose MRSA infection nor to guide or monitor treatment for MRSA infections. Performed at [facility], [address] [place], [place] [address]      Time coordinating discharge: 45 minutes  SIGNED:   Coralie Keens, MD  [facility] 05/17/2020, 10:25 AM

## 2020-05-17 NOTE — Progress Notes (Signed)
Patient discharged home with daughter, discharge instructions given and explained to patient/daughter, she verbalized understanding, patient denies any pain/distress, No pressures injury or wound noted. Accompanied home by daughter.

## 2020-05-17 NOTE — Progress Notes (Signed)
ANTICOAGULATION CONSULT NOTE - Initial Consult  Pharmacy Consult for warfarin Indication: atrial fibrillation, mechanical AVR  Allergies  Allergen Reactions  . Bee Venom Anaphylaxis  . Metoprolol     Other reaction(s): Other (See Comments) Bradycardia.  . Aspirin     Other reaction(s): Other (See Comments)  . Simvastatin     Other reaction(s): Other (See Comments) Muscle pain  . Pork-Derived Products     Patient Measurements: Height: 5\' 5"  (165.1 cm) Weight: 65.4 kg (144 lb 2.9 oz) IBW/kg (Calculated) : 61.5  Vital Signs: Temp: 98.5 F (36.9 C) (07/05 0436) Temp Source: Oral (07/05 0436) BP: 109/54 (07/05 0436) Pulse Rate: 77 (07/05 0436)  Labs: Recent Labs    05/15/20 0246 05/16/20 0544 05/16/20 1124 05/17/20 0519 05/17/20 0843  LABPROT 31.9*  --  21.0*  --  21.0*  INR 3.2*  --  1.9*  --  1.9*  CREATININE 2.37* 2.22*  --  2.10*  --     Estimated Creatinine Clearance: 21.6 mL/min (A) (by C-G formula based on SCr of 2.1 mg/dL (H)).   Medical History: Past Medical History:  Diagnosis Date  . Aortic stenosis    a. s/p redo aortic valve replacement in 1996 with mechanical prosthesis. On Coumadin  . Atrial flutter (HCC)    a. s/p DCCV in 08/2017  . Bundle branch block, left   . Chronic combined systolic and diastolic CHF (congestive heart failure) (HCC)    a. 2012: Echo showing EF of 25-30% b. 02/2016: Echo with EF of 20-25%, Grade 2 DD, mechanical AV functioning normally, PA Pressure 37 mm Hg  . Coronary artery disease 05/1995   a. s/p PCI with stent placement in 1996.  . Dilated cardiomyopathy (HCC)   . Elevated fasting blood sugar   . Hypercholesterolemia   . Hypertension     Medications:  Scheduled:  . amiodarone  200 mg Oral Daily  . amoxicillin-clavulanate  500 mg Oral Q12H  . carvedilol  25 mg Oral BID  . Chlorhexidine Gluconate Cloth  6 each Topical Daily  . furosemide  40 mg Oral Daily  . insulin aspart  0-9 Units Subcutaneous TID WC  .  levothyroxine  50 mcg Oral Q0600  . losartan  50 mg Oral Daily  . mouth rinse  15 mL Mouth Rinse BID  . rosuvastatin  5 mg Oral Q M,W,F  . sodium chloride flush  3 mL Intravenous Q12H  . Warfarin - Pharmacist Dosing Inpatient   Does not apply q1600   Infusions:  . sodium chloride Stopped (05/14/20 1518)  . sodium chloride     PRN: sodium chloride, sodium chloride, acetaminophen, guaiFENesin-dextromethorphan, ondansetron (ZOFRAN) IV, sodium chloride flush, tamsulosin  Assessment: 86 yo male on chronic warfarin for afib and mechanical AVR.  Admitted 7/2 with CAP and HF decompensation.  Home warfarin. dose reported as 1.5mg  daily except 3mg  on Fridays - last dose taken 7/1.  INR on admission 3.2, warfarin held due to hemoptysis.  Pharmacy consulted to resume warfarin today since hemoptysis has improved.  Today, 05/17/2020  INR SUBtherapeutic and unchanged from yesterday after warfarin 5mg  x 1 so far   CBC 7/2: Hgb 11.7, Plts wnl  Drug interaction: azithromycin 7/2-7/4, changed to augmentin which has less potential to affect INR  Tolerating carb modified diet  Goal of Therapy:  INR 2.5 - 3.5 per anticoag clinic notes   Plan:   Repeat 5mg  warfarin today at 1600  If INR does not respond tomorrow, may need to consider  adding full dose Lovenox until INR therapeutic if appropriate per Md and hemoptysis risk has subsided  Daily PT/INR   Hessie Knows, PharmD, BCPS 05/17/2020 10:48 AM

## 2020-05-19 LAB — CULTURE, BLOOD (ROUTINE X 2)
Culture: NO GROWTH
Culture: NO GROWTH
Special Requests: ADEQUATE
Special Requests: ADEQUATE

## 2020-07-28 ENCOUNTER — Telehealth: Payer: Self-pay

## 2020-07-28 NOTE — Telephone Encounter (Signed)
Attempt to call patient, no answer and unable to leave VM-VM full   Last saw Dr. [place] 2020.   Do not see any recent or upcoming appts with Dr. Randie Heinz?

## 2020-07-28 NOTE — Telephone Encounter (Signed)
Pt (of Dr. Excell Seltzer) called today asking where he would need to get a covid test done for Dr. Randie Heinz. He is having vascular surgery but needs a covid test completed first. Please call [phone].

## 2020-07-28 NOTE — Telephone Encounter (Signed)
Dr. Jordan patient 

## 2020-07-29 NOTE — Telephone Encounter (Signed)
Called the number listed below. Verified name and DOB and determined  original documentation was made in the wrong patient's chart. New encounter made with correct pt.

## 2020-08-06 IMAGING — CT CT ABD-PELV W/O CM
2 of 4 series · 16 of 46 positions shown, 18 images · non-contrast
Comparison: None.

CLINICAL DATA: Fevers with right-sided flank pain, initial
encounter

EXAM:
CT ABDOMEN AND PELVIS WITHOUT CONTRAST
TECHNIQUE: Multidetector CT imaging of the abdomen and pelvis was performed
following the standard protocol without IV contrast.

[Series 2: axial st · axial · 0.82mm/px · z∈[-386,+14]mm · 13 of 88 slices shown, 15 images]
[im 4/88  soft-tissue]
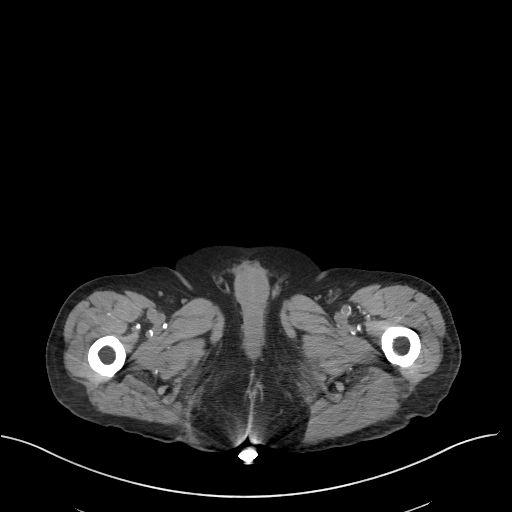
[im 4/88  bone]
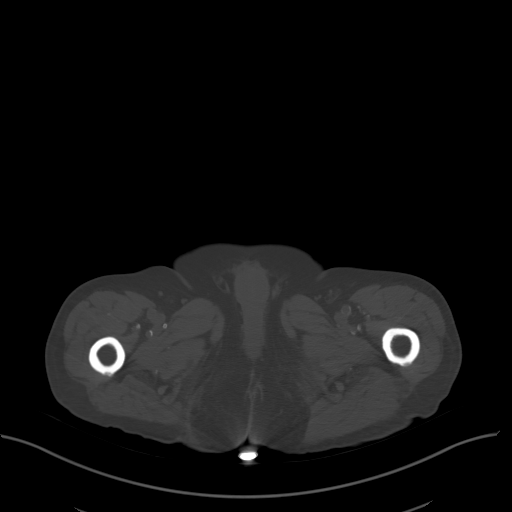
[im 12/88  soft-tissue]
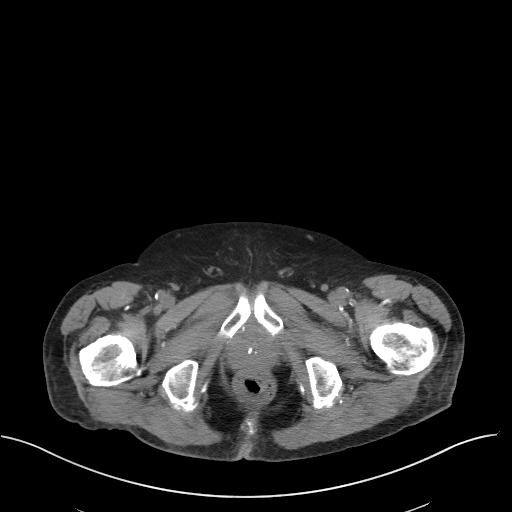
[im 19/88  soft-tissue]
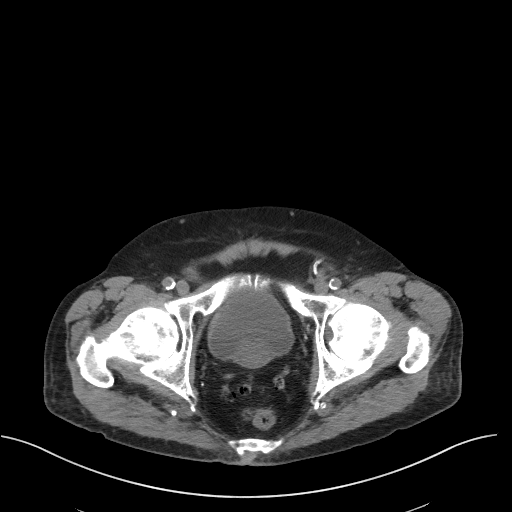
[im 23/88  soft-tissue]
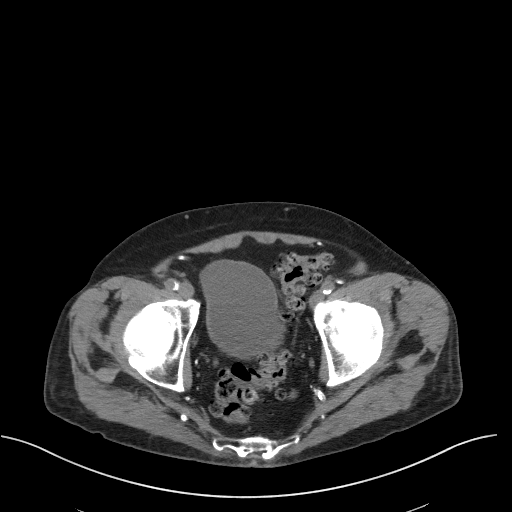
[im 31/88  soft-tissue]
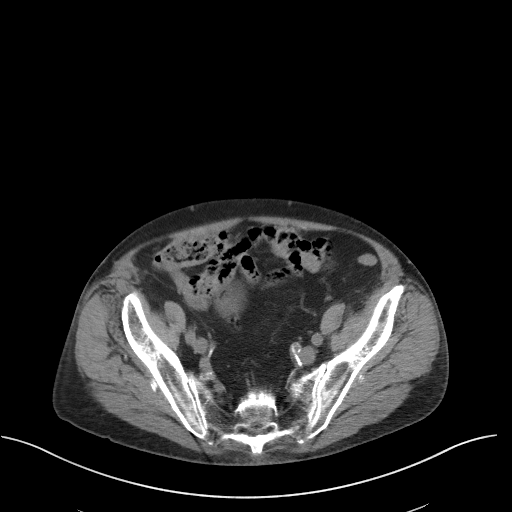
[im 38/88  soft-tissue]
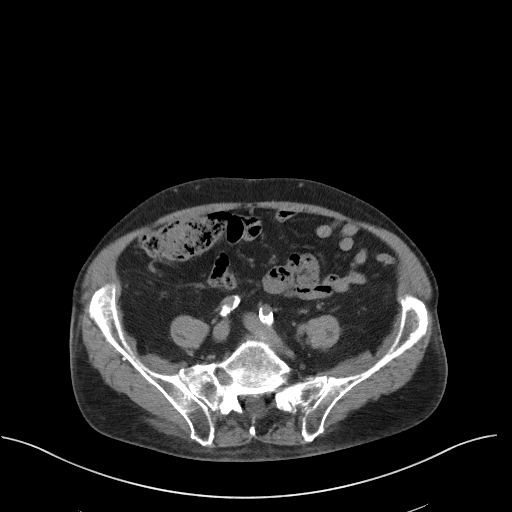
[im 46/88  soft-tissue]
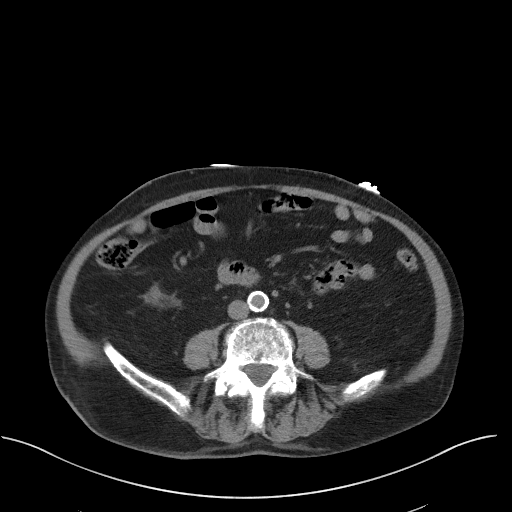
[im 50/88  soft-tissue]
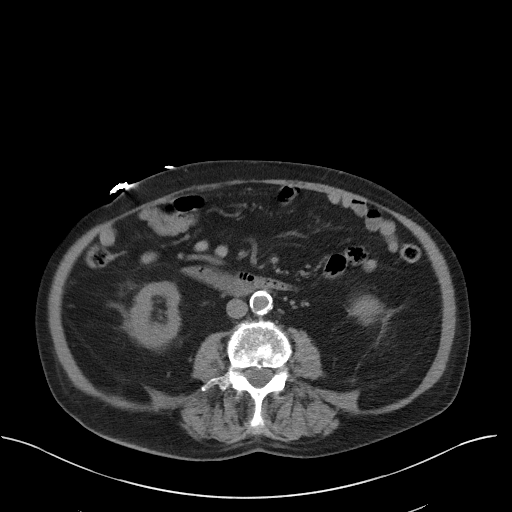
[im 57/88  soft-tissue]
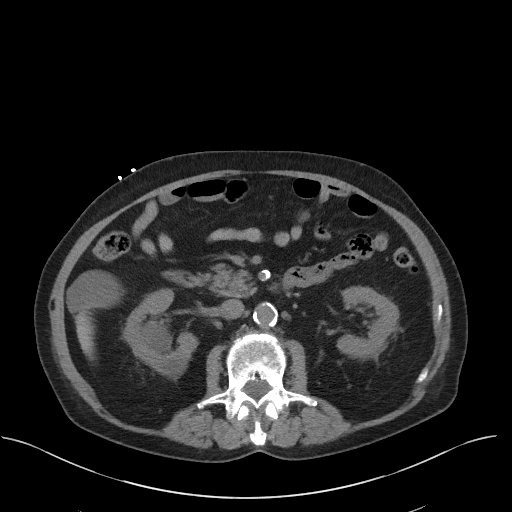
[im 57/88  bone]
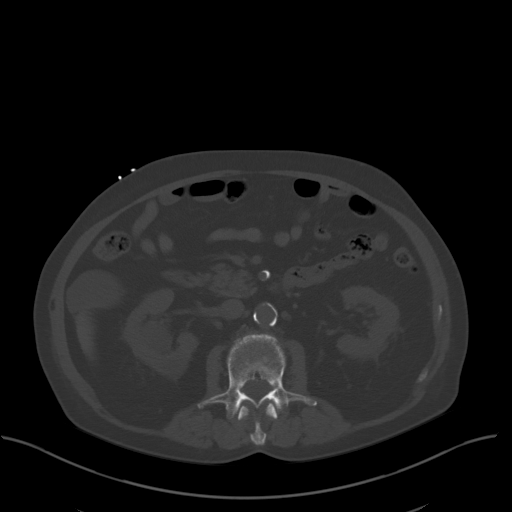
[im 65/88  soft-tissue]
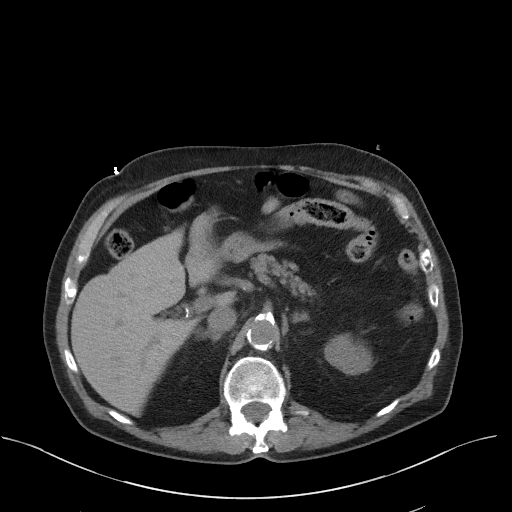
[im 69/88  soft-tissue]
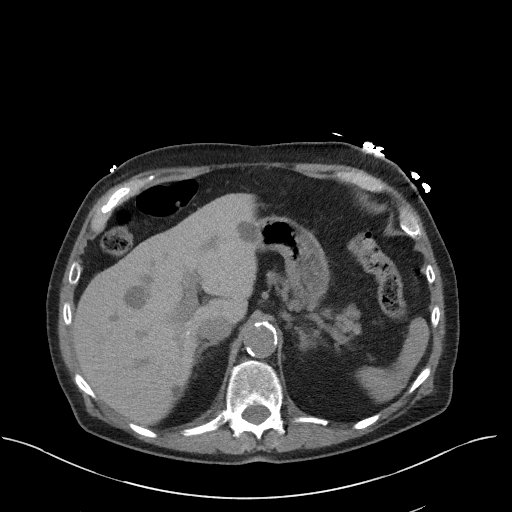
[im 76/88  soft-tissue]
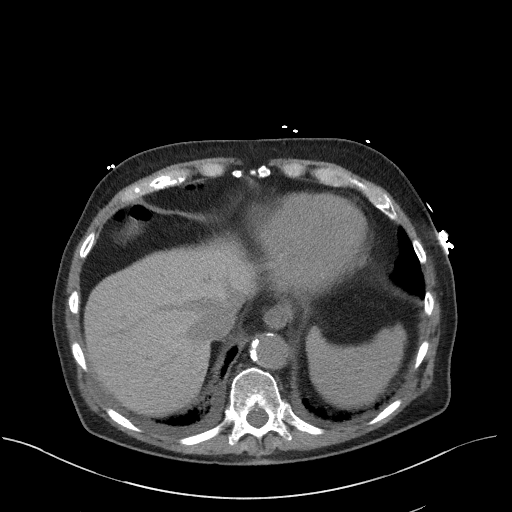
[im 84/88  soft-tissue]
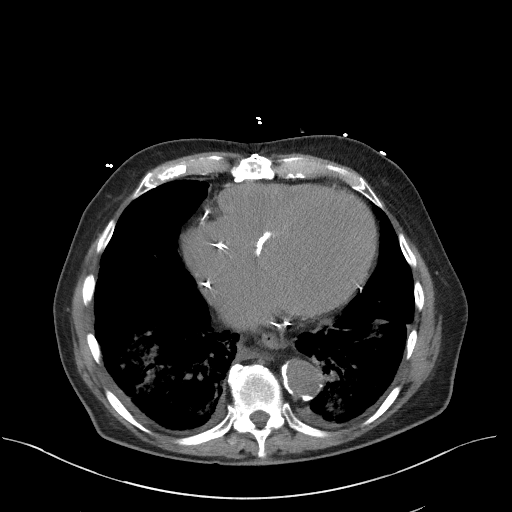

[Series 5: coronal st · coronal · 0.77mm/px · 3 of 101 slices shown]
[im 34/101  soft-tissue]
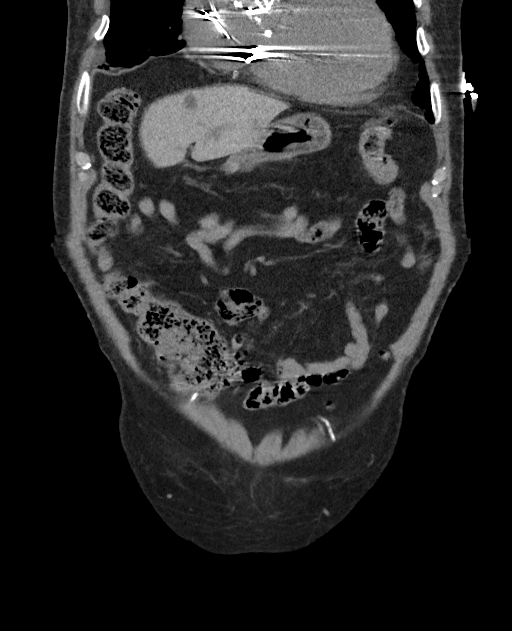
[im 45/101  soft-tissue]
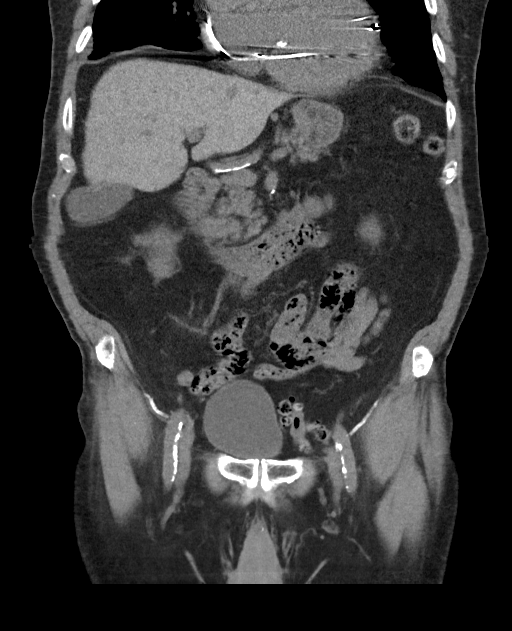
[im 56/101  soft-tissue]
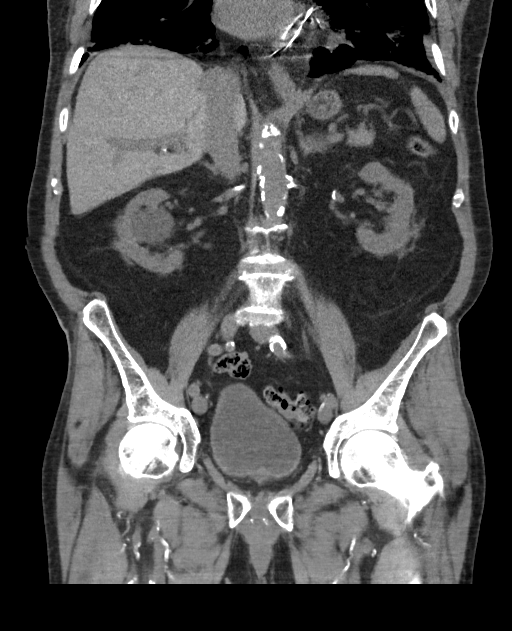

[16 of 46 positions shown; findings below may reference images not displayed]

FINDINGS: Lower chest: Bilateral lower lobe infiltrates are seen which may
represent a component of aspiration. Associated small effusions are
noted bilaterally.

Hepatobiliary: Scattered hepatic cysts are noted. The gallbladder is
within normal limits.

Pancreas: Unremarkable. No pancreatic ductal dilatation or
surrounding inflammatory changes.

Spleen: Normal in size without focal abnormality.

Adrenals/Urinary Tract: Adrenal glands are unremarkable. No renal
calculi or obstructive changes are noted. There is a chronic changes
as described above without acute abnormality. Right parapelvic cyst
measuring 3.8 cm. No obstructive changes are seen. The bladder is
well distended.

Stomach/Bowel: Scattered diverticular change of the colon is noted
without evidence of diverticulitis. No obstructive or inflammatory
changes are seen. The appendix is not well visualized. No
inflammatory changes to suggest appendicitis are noted. Small bowel
and stomach appear within normal limits.

Vascular/Lymphatic: Aortic atherosclerosis. No enlarged abdominal or
pelvic lymph nodes.

Reproductive: Prostate is unremarkable.

Other: No abdominal wall hernia or abnormality. No abdominopelvic
ascites.

Musculoskeletal: Degenerative changes of lumbar spine are seen. Mild
anterolisthesis of L4 on L5 is noted of a degenerative nature.
IMPRESSION: Bilateral lower lobe infiltrates and effusions consistent with
multifocal pneumonia possibly related to aspiration.

## 2021-11-13 DEATH — deceased
